# Patient Record
Sex: Female | Born: 1960 | Hispanic: No | Marital: Married | Smoking: Never smoker
Health system: Southern US, Community
[De-identification: ages and names within clinical notes are randomized; demographics above are authoritative.]

## PROBLEM LIST (undated history)

## (undated) DIAGNOSIS — O00109 Unspecified tubal pregnancy without intrauterine pregnancy: Secondary | ICD-10-CM

## (undated) DIAGNOSIS — N393 Stress incontinence (female) (male): Secondary | ICD-10-CM

## (undated) DIAGNOSIS — Z9852 Vasectomy status: Secondary | ICD-10-CM

## (undated) HISTORY — DX: Vasectomy status: Z98.52

## (undated) HISTORY — DX: Stress incontinence (female) (male): N39.3

---

## 1999-07-18 ENCOUNTER — Other Ambulatory Visit: Admission: RE | Admit: 1999-07-18 | Discharge: 1999-07-18 | Payer: Self-pay | Admitting: Obstetrics and Gynecology

## 2000-01-23 ENCOUNTER — Other Ambulatory Visit: Admission: RE | Admit: 2000-01-23 | Discharge: 2000-01-23 | Payer: Self-pay | Admitting: Obstetrics and Gynecology

## 2000-09-09 ENCOUNTER — Other Ambulatory Visit: Admission: RE | Admit: 2000-09-09 | Discharge: 2000-09-09 | Payer: Self-pay | Admitting: Obstetrics and Gynecology

## 2001-09-23 ENCOUNTER — Other Ambulatory Visit: Admission: RE | Admit: 2001-09-23 | Discharge: 2001-09-23 | Payer: Self-pay | Admitting: Obstetrics and Gynecology

## 2002-09-29 ENCOUNTER — Other Ambulatory Visit: Admission: RE | Admit: 2002-09-29 | Discharge: 2002-09-29 | Payer: Self-pay | Admitting: Obstetrics and Gynecology

## 2003-11-15 ENCOUNTER — Other Ambulatory Visit: Admission: RE | Admit: 2003-11-15 | Discharge: 2003-11-15 | Payer: Self-pay | Admitting: Obstetrics and Gynecology

## 2004-12-12 ENCOUNTER — Other Ambulatory Visit: Admission: RE | Admit: 2004-12-12 | Discharge: 2004-12-12 | Payer: Self-pay | Admitting: Obstetrics and Gynecology

## 2005-12-17 ENCOUNTER — Other Ambulatory Visit: Admission: RE | Admit: 2005-12-17 | Discharge: 2005-12-17 | Payer: Self-pay | Admitting: Obstetrics and Gynecology

## 2006-01-22 ENCOUNTER — Inpatient Hospital Stay (HOSPITAL_COMMUNITY): Admission: AD | Admit: 2006-01-22 | Discharge: 2006-01-22 | Payer: Self-pay | Admitting: Obstetrics and Gynecology

## 2006-01-25 ENCOUNTER — Inpatient Hospital Stay (HOSPITAL_COMMUNITY): Admission: AD | Admit: 2006-01-25 | Discharge: 2006-01-25 | Payer: Self-pay | Admitting: Obstetrics and Gynecology

## 2006-01-28 ENCOUNTER — Inpatient Hospital Stay (HOSPITAL_COMMUNITY): Admission: AD | Admit: 2006-01-28 | Discharge: 2006-01-28 | Payer: Self-pay | Admitting: Obstetrics and Gynecology

## 2006-02-04 ENCOUNTER — Inpatient Hospital Stay (HOSPITAL_COMMUNITY): Admission: AD | Admit: 2006-02-04 | Discharge: 2006-02-04 | Payer: Self-pay | Admitting: Obstetrics and Gynecology

## 2006-02-11 ENCOUNTER — Inpatient Hospital Stay (HOSPITAL_COMMUNITY): Admission: AD | Admit: 2006-02-11 | Discharge: 2006-02-11 | Payer: Self-pay | Admitting: Obstetrics and Gynecology

## 2008-12-20 ENCOUNTER — Ambulatory Visit (HOSPITAL_COMMUNITY): Admission: RE | Admit: 2008-12-20 | Discharge: 2008-12-20 | Payer: Self-pay | Admitting: Family Medicine

## 2009-08-23 ENCOUNTER — Ambulatory Visit: Payer: Self-pay | Admitting: Women's Health

## 2009-08-23 ENCOUNTER — Other Ambulatory Visit: Admission: RE | Admit: 2009-08-23 | Discharge: 2009-08-23 | Payer: Self-pay | Admitting: Gynecology

## 2010-02-14 ENCOUNTER — Ambulatory Visit: Payer: Self-pay | Admitting: Women's Health

## 2010-04-02 ENCOUNTER — Encounter: Payer: Self-pay | Admitting: Family Medicine

## 2010-05-01 ENCOUNTER — Ambulatory Visit (INDEPENDENT_AMBULATORY_CARE_PROVIDER_SITE_OTHER): Payer: PRIVATE HEALTH INSURANCE | Admitting: Emergency Medicine

## 2010-05-01 ENCOUNTER — Encounter (INDEPENDENT_AMBULATORY_CARE_PROVIDER_SITE_OTHER): Payer: Self-pay

## 2010-05-01 ENCOUNTER — Encounter: Payer: Self-pay | Admitting: Emergency Medicine

## 2010-05-01 DIAGNOSIS — E785 Hyperlipidemia, unspecified: Secondary | ICD-10-CM | POA: Insufficient documentation

## 2010-05-01 DIAGNOSIS — K5289 Other specified noninfective gastroenteritis and colitis: Secondary | ICD-10-CM

## 2010-05-04 ENCOUNTER — Telehealth (INDEPENDENT_AMBULATORY_CARE_PROVIDER_SITE_OTHER): Payer: Self-pay | Admitting: *Deleted

## 2010-05-08 NOTE — Letter (Signed)
Summary: Out of Work  [facility]  [address]   [place], [place] [address]   Phone: [phone]  Fax: [phone]    May 01, 2010   Employee:  Latasha Bass    To Whom It May Concern:   For Medical reasons, please excuse the above named employee from work for the following dates:  Start:   05/01/2010; May return on 05/02/2010    If you need additional information, please feel free to contact our office.         Sincerely,    [id number] Yetta Barre RN

## 2010-05-08 NOTE — Progress Notes (Signed)
  Phone Note Outgoing Call Call back at [facility] Phone [phone] P [facility]     Call placed by: Lajean Saver RN,  May 04, 2010 2:57 PM Call placed to: Patient Action Taken: Phone Call Completed Summary of Call: Callback: Patient reports her symptoms are gone.

## 2010-05-08 NOTE — Assessment & Plan Note (Signed)
Summary: STOMACH BUG(rm5)   Vital Signs:  Patient Profile:   50 Years Old Female CC:      Abdominal Pain Height:     62 inches Weight:      163 pounds O2 Sat:      100 % O2 treatment:    Room Air Pulse rate:   75 / minute Resp:     18 per minute BP sitting:   121 / 82  (left arm) Cuff size:   regular  Vitals Entered By: Burnard Hawthorne RN (May 01, 2010 9:24 AM)                  Updated Prior Medication List: No Medications Current Allergies: No known allergies History of Present Illness History from: patient Chief Complaint: Abdominal Pain History of Present Illness: N/V x1, but D x10  yesterday.  Has been doing on for a few days.  She thinks it is getting a little better but is tired.  She hasn't eaten today but kept down coffee.  No known sick contacts or spoiled food.  No F/C.  No other URI symptoms.  Not taking any OTC meds.  No blood in BM or emesis.  REVIEW OF SYSTEMS Constitutional Symptoms      Denies fever, chills, night sweats, weight loss, weight gain, and fatigue.  Eyes       Denies change in vision, eye pain, eye discharge, glasses, contact lenses, and eye surgery. Ear/Nose/Throat/Mouth       Denies hearing loss/aids, change in hearing, ear pain, ear discharge, dizziness, frequent runny nose, frequent nose bleeds, sinus problems, sore throat, hoarseness, and tooth pain or bleeding.  Respiratory       Denies dry cough, productive cough, wheezing, shortness of breath, asthma, bronchitis, and emphysema/COPD.  Cardiovascular       Denies murmurs, chest pain, and tires easily with exhertion.    Gastrointestinal       Complains of stomach pain, nausea/vomiting, and diarrhea.      Denies constipation, blood in bowel movements, and indigestion. Genitourniary       Denies painful urination, kidney stones, and loss of urinary control. Neurological       Denies paralysis, seizures, and fainting/blackouts. Musculoskeletal       Denies muscle pain, joint pain,  joint stiffness, decreased range of motion, redness, swelling, muscle weakness, and gout.  Skin       Denies bruising, unusual mles/lumps or sores, and hair/skin or nail changes.  Psych       Denies mood changes, temper/anger issues, anxiety/stress, speech problems, depression, and sleep problems. Other Comments: vomiting started Sunday night, w/out vomiting since one episode. Now after eating she has 5-6 loose stools.  states she has loss 5 lbs since Sunday   Past History:  Past Medical History: Hyperlipidemia  Past Surgical History: Denies surgical history  Family History: dad had cancer(unknown type)  Social History: non smoker alcohol- yes recreational drugs- no Physical Exam General appearance: well developed, well nourished, no acute distress Chest/Lungs: no rales, wheezes, or rhonchi bilateral, breath sounds equal without effort Heart: regular rate and  rhythm, no murmur Abdomen: mild epigastric and LLQ tenderness, no rebound, no distension, soft, +BS4Q, no RLQ or RUQ pain, no CVAT Skin: no obvious rashes or lesions MSE: oriented to time, place, and person Assessment New Problems: GASTROENTERITIS (ICD-558.9) HYPERLIPIDEMIA (ICD-272.4)   Patient Education: Patient and/or caregiver instructed in the following: rest, fluids.  Plan New Medications/Changes: PROMETHAZINE HCL 12.5 MG TABS (PROMETHAZINE HCL)  1 by mouth q6 hrs as needed  #15 x 0, 05/01/2010, Hoyt Koch MD  New Orders: New Patient Level III [phone] Planning Comments:   Patient with viral gastroenteritis.  Will treat with phenergan and OTC immodium.  I educated the patient on a bland diet for the next 6 days, hydration, eating smaller portions but getting some calories into her system which will give her energy and make her feel better.  Gave work note today to keep her from infecting others.  [place] virus has been going around but this seems to be milder.  Follow-up with your primary care physician  if not improving or if getting worse.   The patient and/or caregiver has been counseled thoroughly with regard to medications prescribed including dosage, schedule, interactions, rationale for use, and possible side effects and they verbalize understanding.  Diagnoses and expected course of recovery discussed and will return if not improved as expected or if the condition worsens. Patient and/or caregiver verbalized understanding.  Prescriptions: PROMETHAZINE HCL 12.5 MG TABS (PROMETHAZINE HCL) 1 by mouth q6 hrs as needed  #15 x 0   Entered and Authorized by:   Hoyt Koch MD   Signed by:   Hoyt Koch MD on 05/01/2010   Method used:   Print then Give to Patient   RxID:   [id number]   Orders Added: 1)  New Patient Level III [55732]

## 2010-08-23 ENCOUNTER — Ambulatory Visit: Payer: PRIVATE HEALTH INSURANCE

## 2011-05-06 DIAGNOSIS — N393 Stress incontinence (female) (male): Secondary | ICD-10-CM | POA: Insufficient documentation

## 2011-05-10 ENCOUNTER — Other Ambulatory Visit (HOSPITAL_COMMUNITY)
Admission: RE | Admit: 2011-05-10 | Discharge: 2011-05-10 | Disposition: A | Discharge status: Home / Self Care | Payer: PRIVATE HEALTH INSURANCE | Source: Ambulatory Visit | Attending: Obstetrics and Gynecology | Admitting: Obstetrics and Gynecology

## 2011-05-10 ENCOUNTER — Encounter: Payer: Self-pay | Admitting: Women's Health

## 2011-05-10 ENCOUNTER — Ambulatory Visit (INDEPENDENT_AMBULATORY_CARE_PROVIDER_SITE_OTHER): Payer: PRIVATE HEALTH INSURANCE | Admitting: Women's Health

## 2011-05-10 VITALS — BP 112/70 | Ht 62.5 in | Wt 159.0 lb

## 2011-05-10 DIAGNOSIS — Z833 Family history of diabetes mellitus: Secondary | ICD-10-CM

## 2011-05-10 DIAGNOSIS — G4700 Insomnia, unspecified: Secondary | ICD-10-CM

## 2011-05-10 DIAGNOSIS — R350 Frequency of micturition: Secondary | ICD-10-CM

## 2011-05-10 DIAGNOSIS — Z01419 Encounter for gynecological examination (general) (routine) without abnormal findings: Secondary | ICD-10-CM | POA: Insufficient documentation

## 2011-05-10 DIAGNOSIS — Z113 Encounter for screening for infections with a predominantly sexual mode of transmission: Secondary | ICD-10-CM

## 2011-05-10 DIAGNOSIS — Z13220 Encounter for screening for lipoid disorders: Secondary | ICD-10-CM

## 2011-05-10 LAB — URINALYSIS W MICROSCOPIC + REFLEX CULTURE
Bilirubin Urine: NEGATIVE
Glucose, UA: NEGATIVE mg/dL
Leukocytes, UA: NEGATIVE
Protein, ur: NEGATIVE mg/dL
Specific Gravity, Urine: 1.015 (ref 1.005–1.030)
Urobilinogen, UA: 0.2 mg/dL (ref 0.0–1.0)

## 2011-05-10 MED ORDER — ZOLPIDEM TARTRATE 10 MG PO TABS: 10.0000 mg | ORAL_TABLET | Freq: Every evening | ORAL | Status: DC | PRN

## 2011-05-10 NOTE — Progress Notes (Signed)
Latasha Bass [birth year] [id number]    History:    The patient presents for annual exam.  Cycles every 2-4 months for 3 days for the past year,LMP 12/12. New partner/vasectomy. Continues with problems with urinary frequency, with no pain or burning. Denies menopausal symptoms. Has had problems with insomnia for years has gotten increasingly problematic. History of normal Paps, only Pap in our office was 6/11. Has not had a mammogram for several years, patient reports normal.    Past medical history, past surgical history, family history and social history were all reviewed and documented in the EPIC chart. Currently on [employer]. Son Molli Hazard expected to graduate from [employer] in May.  ROS:  A  ROS was performed and pertinent positives and negatives are included in the history.  Exam:  Filed Vitals:   05/10/11 1551  BP: 112/70    General appearance:  Normal Head/Neck:  Normal, without cervical or supraclavicular adenopathy. Thyroid:  Symmetrical, normal in size, without palpable masses or nodularity. Respiratory  Effort:  Normal  Auscultation:  Clear without wheezing or rhonchi Cardiovascular  Auscultation:  Regular rate, without rubs, murmurs or gallops  Edema/varicosities:  Not grossly evident Abdominal  Soft,nontender, without masses, guarding or rebound.  Liver/spleen:  No organomegaly noted  Hernia:  None appreciated  Skin  Inspection:  Grossly normal  Palpation:  Grossly normal Neurologic/psychiatric  Orientation:  Normal with appropriate conversation.  Mood/affect:  Normal  Genitourinary    Breasts: Examined lying and sitting.     Right: Without masses, retractions, discharge or axillary adenopathy.     Left: Without masses, retractions, discharge or axillary adenopathy.   Inguinal/mons:  Normal without inguinal adenopathy  External genitalia:  Normal  BUS/Urethra/Skene's glands:  Normal  Bladder:  Normal  Vagina:  Normal  Cervix:  Normal/stenotic  Uterus:    normal in size, shape and contour.  Midline and mobile  Adnexa/parametria:     Rt: Without masses or tenderness.   Lt: Without masses or tenderness.  Anus and perineum: Normal  Digital rectal exam: Normal sphincter tone without palpated masses or tenderness  Assessment/Plan:  51 y.o. DHF G3 P1 for annual exam.    Perimenopausal irregular cycles Insomnia Urinary frequency/always without infection  Plan: Reviewed if cycle goes greater than 3 months to return to office for [facility]. Condoms encouraged until permanent partner. SBE's, annual mammogram, [facility] given, instructed to schedule. UA completely negative, instructed to schedule screening colonoscopy. Encouraged to continue increased exercise, decreasing calories for weight loss. History of increased triglycerides, discussed fish oil supplement, low saturated fat diet. Sleep hygiene discussed, Ambien 10 mg by mouth at bedtime when necessary #30 with no refills. CBC, glucose, lipid profile, UA, Pap., GC/Chlamydia, declines HIV, hepatitis and RPR. Reviewed over active bladder, urologist consult declines.     Harrington Challenger [facility], 4:56 PM 05/10/2011 `

## 2011-05-10 NOTE — Patient Instructions (Signed)
Insomnia Insomnia is frequent trouble falling and/or staying asleep. Insomnia can be a long term problem or a short term problem. Both are common. Insomnia can be a short term problem when the wakefulness is related to a certain stress or worry. Long term insomnia is often related to ongoing stress during waking hours and/or poor sleeping habits. Overtime, sleep deprivation itself can make the problem worse. Every little thing feels more severe because you are overtired and your ability to cope is decreased. CAUSES   Stress, anxiety, and depression.   Poor sleeping habits.   Distractions such as TV in the bedroom.   Naps close to bedtime.   Engaging in emotionally charged conversations before bed.   Technical reading before sleep.   Alcohol and other sedatives. They may make the problem worse. They can hurt normal sleep patterns and normal dream activity.   Stimulants such as caffeine for several hours prior to bedtime.   Pain syndromes and shortness of breath can cause insomnia.   Exercise late at night.   Changing time zones may cause sleeping problems (jet lag).  It is sometimes helpful to have someone observe your sleeping patterns. They should look for periods of not breathing during the night (sleep apnea). They should also look to see how long those periods last. If you live alone or observers are uncertain, you can also be observed at a sleep clinic where your sleep patterns will be professionally monitored. Sleep apnea requires a checkup and treatment. Give your caregivers your medical history. Give your caregivers observations your family has made about your sleep.  SYMPTOMS   Not feeling rested in the morning.   Anxiety and restlessness at bedtime.   Difficulty falling and staying asleep.  TREATMENT   Your caregiver may prescribe treatment for an underlying medical disorders. Your caregiver can give advice or help if you are using alcohol or other drugs for  self-medication. Treatment of underlying problems will usually eliminate insomnia problems.   Medications can be prescribed for short time use. They are generally not recommended for lengthy use.   Over-the-counter sleep medicines are not recommended for lengthy use. They can be habit forming.   You can promote easier sleeping by making lifestyle changes such as:   Using relaxation techniques that help with breathing and reduce muscle tension.   Exercising earlier in the day.   Changing your diet and the time of your last meal. No night time snacks.   Establish a regular time to go to bed.   Counseling can help with stressful problems and worry.   Soothing music and white noise may be helpful if there are background noises you cannot remove.   Stop tedious detailed work at least one hour before bedtime.  HOME CARE INSTRUCTIONS   Keep a diary. Inform your caregiver about your progress. This includes any medication side effects. See your caregiver regularly. Take note of:   Times when you are asleep.   Times when you are awake during the night.   The quality of your sleep.   How you feel the next day.  This information will help your caregiver care for you.  Get out of bed if you are still awake after 15 minutes. Read or do some quiet activity. Keep the lights down. Wait until you feel sleepy and go back to bed.   Keep regular sleeping and waking hours. Avoid naps.   Exercise regularly.   Avoid distractions at bedtime. Distractions include watching television or engaging   in any intense or detailed activity like attempting to balance the household checkbook.   Develop a bedtime ritual. Keep a familiar routine of bathing, brushing your teeth, climbing into bed at the same time each night, listening to soothing music. Routines increase the success of falling to sleep faster.   Use relaxation techniques. This can be using breathing and muscle tension release routines. It can  also include visualizing peaceful scenes. You can also help control troubling or intruding thoughts by keeping your mind occupied with boring or repetitive thoughts like the old concept of counting sheep. You can make it more creative like imagining planting one beautiful flower after another in your backyard garden.   During your day, work to eliminate stress. When this is not possible use some of the previous suggestions to help reduce the anxiety that accompanies stressful situations.  MAKE SURE YOU:   Understand these instructions.   Will watch your condition.   Will get help right away if you are not doing well or get worse.  Document Released: 02/23/2000 Document Revised: 11/07/2010 Document Reviewed: 03/25/2007 [facility] Patient Information 2012 [place], [place].Health Maintenance, Females A healthy lifestyle and preventative care can promote health and wellness.  Maintain regular health, dental, and eye exams.   Eat a healthy diet. Foods like vegetables, fruits, whole grains, low-fat dairy products, and lean protein foods contain the nutrients you need without too many calories. Decrease your intake of foods high in solid fats, added sugars, and salt. Get information about a proper diet from your caregiver, if necessary.   Regular physical exercise is one of the most important things you can do for your health. Most adults should get at least 150 minutes of moderate-intensity exercise (any activity that increases your heart rate and causes you to sweat) each week. In addition, most adults need muscle-strengthening exercises on 2 or more days a week.    Maintain a healthy weight. The body mass index (BMI) is a screening tool to identify possible weight problems. It provides an estimate of body fat based on height and weight. Your caregiver can help determine your BMI, and can help you achieve or maintain a healthy weight. For adults 20 years and older:   A BMI below 18.5 is considered  underweight.   A BMI of 18.5 to 24.9 is normal.   A BMI of 25 to 29.9 is considered overweight.   A BMI of 30 and above is considered obese.   Maintain normal blood lipids and cholesterol by exercising and minimizing your intake of saturated fat. Eat a balanced diet with plenty of fruits and vegetables. Blood tests for lipids and cholesterol should begin at age 12 and be repeated every 5 years. If your lipid or cholesterol levels are high, you are over 50, or you are a high risk for heart disease, you may need your cholesterol levels checked more frequently.Ongoing high lipid and cholesterol levels should be treated with medicines if diet and exercise are not effective.   If you smoke, find out from your caregiver how to quit. If you do not use tobacco, do not start.   If you are pregnant, do not drink alcohol. If you are breastfeeding, be very cautious about drinking alcohol. If you are not pregnant and choose to drink alcohol, do not exceed 1 drink per day. One drink is considered to be 12 ounces (355 mL) of beer, 5 ounces (148 mL) of wine, or 1.5 ounces (44 mL) of liquor.   Avoid use  of street drugs. Do not share needles with anyone. Ask for help if you need support or instructions about stopping the use of drugs.   High blood pressure causes heart disease and increases the risk of stroke. Blood pressure should be checked at least every 1 to 2 years. Ongoing high blood pressure should be treated with medicines, if weight loss and exercise are not effective.   If you are 1 to 51 years old, ask your caregiver if you should take aspirin to prevent strokes.   Diabetes screening involves taking a blood sample to check your fasting blood sugar level. This should be done once every 3 years, after age 95, if you are within normal weight and without risk factors for diabetes. Testing should be considered at a younger age or be carried out more frequently if you are overweight and have at least 1 risk  factor for diabetes.   Breast cancer screening is essential preventative care for women. You should practice "breast self-awareness." This means understanding the normal appearance and feel of your breasts and may include breast self-examination. Any changes detected, no matter how small, should be reported to a caregiver. Women in their 51s and 30s should have a clinical breast exam (CBE) by a caregiver as part of a regular health exam every 1 to 3 years. After age 52, women should have a CBE every year. Starting at age 21, women should consider having a mammogram (breast X-ray) every year. Women who have a family history of breast cancer should talk to their caregiver about genetic screening. Women at a high risk of breast cancer should talk to their caregiver about having an MRI and a mammogram every year.   The Pap test is a screening test for cervical cancer. Women should have a Pap test starting at age 9. Between ages 67 and 29, Pap tests should be repeated every 2 years. Beginning at age 1, you should have a Pap test every 3 years as long as the past 3 Pap tests have been normal. If you had a hysterectomy for a problem that was not cancer or a condition that could lead to cancer, then you no longer need Pap tests. If you are between ages 17 and 55, and you have had normal Pap tests going back 10 years, you no longer need Pap tests. If you have had past treatment for cervical cancer or a condition that could lead to cancer, you need Pap tests and screening for cancer for at least 20 years after your treatment. If Pap tests have been discontinued, risk factors (such as a new sexual partner) need to be reassessed to determine if screening should be resumed. Some women have medical problems that increase the chance of getting cervical cancer. In these cases, your caregiver may recommend more frequent screening and Pap tests.   The human papillomavirus (HPV) test is an additional test that may be used for  cervical cancer screening. The HPV test looks for the virus that can cause the cell changes on the cervix. The cells collected during the Pap test can be tested for HPV. The HPV test could be used to screen women aged 59 years and older, and should be used in women of any age who have unclear Pap test results. After the age of 54, women should have HPV testing at the same frequency as a Pap test.   Colorectal cancer can be detected and often prevented. Most routine colorectal cancer screening begins at the age  of 82 and continues through age 19. However, your caregiver may recommend screening at an earlier age if you have risk factors for colon cancer. On a yearly basis, your caregiver may provide home test kits to check for hidden blood in the stool. Use of a small camera at the end of a tube, to directly examine the colon (sigmoidoscopy or colonoscopy), can detect the earliest forms of colorectal cancer. Talk to your caregiver about this at age 63, when routine screening begins. Direct examination of the colon should be repeated every 5 to 10 years through age 11, unless early forms of pre-cancerous polyps or small growths are found.   Practice safe sex. Use condoms and avoid high-risk sexual practices to reduce the spread of sexually transmitted infections (STIs). Sexually active women aged 4 and younger should be checked for Chlamydia, which is a common sexually transmitted infection. Older women with new or multiple partners should also be tested for Chlamydia. Testing for other STIs is recommended if you are sexually active and at increased risk.   Osteoporosis is a disease in which the bones lose minerals and strength with aging. This can result in serious bone fractures. The risk of osteoporosis can be identified using a bone density scan. Women ages 56 and over and women at risk for fractures or osteoporosis should discuss screening with their caregivers. Ask your caregiver whether you should be  taking a calcium supplement or vitamin D to reduce the rate of osteoporosis.   Menopause can be associated with physical symptoms and risks. Hormone replacement therapy is available to decrease symptoms and risks. You should talk to your caregiver about whether hormone replacement therapy is right for you.   Use sunscreen with a sun protection factor (SPF) of 30 or greater. Apply sunscreen liberally and repeatedly throughout the day. You should seek shade when your shadow is shorter than you. Protect yourself by wearing long sleeves, pants, a wide-brimmed hat, and sunglasses year round, whenever you are outdoors.   Notify your caregiver of new moles or changes in moles, especially if there is a change in shape or color. Also notify your caregiver if a mole is larger than the size of a pencil eraser.   Stay current with your immunizations.  Document Released: 09/10/2010 Document Revised: 11/07/2010 Document Reviewed: 09/10/2010 [facility] Patient Information 2012 [place], [place].

## 2011-05-11 LAB — CBC WITH DIFFERENTIAL/PLATELET
Basophils Relative: 0 % (ref 0–1)
Eosinophils Absolute: 0.1 10*3/uL (ref 0.0–0.7)
Hemoglobin: 12.8 g/dL (ref 12.0–15.0)
Lymphs Abs: 2 10*3/uL (ref 0.7–4.0)
MCH: 28.4 pg (ref 26.0–34.0)
MCV: 88.2 fL (ref 78.0–100.0)
Monocytes Relative: 6 % (ref 3–12)
Platelets: 302 10*3/uL (ref 150–400)
WBC: 7.1 10*3/uL (ref 4.0–10.5)

## 2011-05-11 LAB — LIPID PANEL
HDL: 66 mg/dL (ref >39)
LDL Cholesterol: 131 mg/dL — ABNORMAL HIGH (ref 0–99)
Total CHOL/HDL Ratio: 3.4 Ratio
VLDL: 26 mg/dL (ref 0–40)

## 2011-05-11 LAB — GLUCOSE, RANDOM: Glucose, Bld: 94 mg/dL (ref 70–99)

## 2011-05-11 LAB — GC/CHLAMYDIA PROBE AMP, GENITAL: GC Probe Amp, Genital: NEGATIVE

## 2011-08-30 ENCOUNTER — Other Ambulatory Visit: Payer: Self-pay | Admitting: Women's Health

## 2011-08-30 DIAGNOSIS — Z1231 Encounter for screening mammogram for malignant neoplasm of breast: Secondary | ICD-10-CM

## 2011-09-24 ENCOUNTER — Ambulatory Visit (HOSPITAL_COMMUNITY)
Admission: RE | Admit: 2011-09-24 | Discharge: 2011-09-24 | Disposition: A | Discharge status: Home / Self Care | Payer: PRIVATE HEALTH INSURANCE | Source: Ambulatory Visit | Attending: Women's Health | Admitting: Women's Health

## 2011-09-24 DIAGNOSIS — Z1231 Encounter for screening mammogram for malignant neoplasm of breast: Secondary | ICD-10-CM

## 2012-05-18 ENCOUNTER — Encounter: Payer: PRIVATE HEALTH INSURANCE | Admitting: Women's Health

## 2012-05-25 ENCOUNTER — Encounter: Payer: PRIVATE HEALTH INSURANCE | Admitting: Women's Health

## 2012-06-08 ENCOUNTER — Encounter: Payer: Self-pay | Admitting: Women's Health

## 2012-06-08 ENCOUNTER — Ambulatory Visit (INDEPENDENT_AMBULATORY_CARE_PROVIDER_SITE_OTHER): Payer: PRIVATE HEALTH INSURANCE | Admitting: Women's Health

## 2012-06-08 VITALS — BP 112/72 | Ht 62.5 in | Wt 148.5 lb

## 2012-06-08 DIAGNOSIS — Z780 Asymptomatic menopausal state: Secondary | ICD-10-CM

## 2012-06-08 DIAGNOSIS — Z13220 Encounter for screening for lipoid disorders: Secondary | ICD-10-CM

## 2012-06-08 DIAGNOSIS — Z01419 Encounter for gynecological examination (general) (routine) without abnormal findings: Secondary | ICD-10-CM

## 2012-06-08 DIAGNOSIS — Z833 Family history of diabetes mellitus: Secondary | ICD-10-CM

## 2012-06-08 LAB — CBC WITH DIFFERENTIAL/PLATELET
Eosinophils Absolute: 0.1 10*3/uL (ref 0.0–0.7)
Eosinophils Relative: 1 % (ref 0–5)
HCT: 39.7 % (ref 36.0–46.0)
Lymphocytes Relative: 39 % (ref 12–46)
Lymphs Abs: 2.7 10*3/uL (ref 0.7–4.0)
MCH: 29.2 pg (ref 26.0–34.0)
MCV: 87.3 fL (ref 78.0–100.0)
Neutro Abs: 3.7 10*3/uL (ref 1.7–7.7)
Platelets: 294 10*3/uL (ref 150–400)
RBC: 4.55 MIL/uL (ref 3.87–5.11)
RDW: 13.1 % (ref 11.5–15.5)
WBC: 6.9 10*3/uL (ref 4.0–10.5)

## 2012-06-08 LAB — LIPID PANEL
Cholesterol: 269 mg/dL — ABNORMAL HIGH (ref 0–200)
Triglycerides: 94 mg/dL (ref <150)
VLDL: 19 mg/dL (ref 0–40)

## 2012-06-08 MED ORDER — ESTRADIOL 10 MCG VA TABS: 1.0000 | ORAL_TABLET | VAGINAL | Status: DC

## 2012-06-08 NOTE — Progress Notes (Signed)
Latasha Bass 1961/03/11 [id number]    History:    The patient presents for annual exam.  Amenorrheic greater than one year, no longer having menopausal symptoms, occasional vaginal dryness. Continues to have stress incontinence with exercise and urinary frequency. Husband vasectomy. Continues to have problems with insomnia using natural products. History of normal Paps and mammograms. Has had some elevated cholesterol in the past but has lost 10 pounds in past year with exercise and Weight Watchers. Has not had a colonoscopy. Normal DEXA in primary care 2 years ago.   Past medical history, past surgical history, family history and social history were all reviewed and documented in the EPIC chart. Works at a call center/distribution area. Molli Hazard graduated [employer] and is working. Has 2 stepsons. Parents hypertensive, father bladder cancer.   ROS:  A  ROS was performed and pertinent positives and negatives are included in the history.  Exam:  Filed Vitals:   06/08/12 1611  BP: 112/72    General appearance:  Normal Head/Neck:  Normal, without cervical or supraclavicular adenopathy. Thyroid:  Symmetrical, normal in size, without palpable masses or nodularity. Respiratory  Effort:  Normal  Auscultation:  Clear without wheezing or rhonchi Cardiovascular  Auscultation:  Regular rate, without rubs, murmurs or gallops  Edema/varicosities:  Not grossly evident Abdominal  Soft,nontender, without masses, guarding or rebound.  Liver/spleen:  No organomegaly noted  Hernia:  None appreciated  Skin  Inspection:  Grossly normal  Palpation:  Grossly normal Neurologic/psychiatric  Orientation:  Normal with appropriate conversation.  Mood/affect:  Normal  Genitourinary    Breasts: Examined lying and sitting.     Right: Without masses, retractions, discharge or axillary adenopathy.     Left: Without masses, retractions, discharge or axillary adenopathy.   Inguinal/mons:  Normal without  inguinal adenopathy  External genitalia:  Normal  BUS/Urethra/Skene's glands:  Normal  Bladder:  Normal  Vagina:  Normal/mild atrophic changes  Cervix:  Normal  Uterus:  normal in size, shape and contour.  Midline and mobile  Adnexa/parametria:     Rt: Without masses or tenderness.   Lt: Without masses or tenderness.  Anus and perineum: Normal  Digital rectal exam: Normal sphincter tone without palpated masses or tenderness  Assessment/Plan:  52 y.o. MWF G3P1 for annual exam with complaint of increasing stress incontinence with exercise.  Postmenopausal on no HRT/no bleeding Insomnia   Plan: SBE's, continue annual mammogram, calcium rich diet, vitamin D 2000 daily encouraged. Instructed to schedule DEXA., Will schedule here. CBC, glucose, lipid panel, UA, Pap normal 2013, new screening guidelines reviewed. Instructed to schedule colonoscopy. Congratulated on weight loss with diet and exercise. Insomnia reviewed declines other options, sleep hygiene reviewed. Reviewed Vagifem for stress incontinence may help, samples given slight systemic absorption with risks of blood clots and strokes reviewed. Instructed to call if no relief.     Harrington Challenger WHNP, 5:22 PM 06/08/2012

## 2012-06-08 NOTE — Patient Instructions (Signed)
Colonoscopy  Dr Loreta Ave or Baird Lyons Vit D 2000 daily Dexa/ Bone density Health Recommendations for Postmenopausal Women Based on the Results of the Women's Health Initiative [facility]) and Other Studies The WHI is a major 15-year research program to address the most common causes of death, disability and poor quality of life in postmenopausal women. Some of these causes are heart disease, cancer, bone loss (osteoporosis) and others. Taking into account all of the findings from [facility] and other studies, here are bottom-line health recommendations for women: CARDIOVASCULAR DISEASE Heart Disease: A heart attack is a medical emergency. Know the signs and symptoms of a heart attack. Hormone therapy should not be used to prevent heart disease. In women with heart disease, hormone therapy should not be used to prevent further disease. Hormone therapy increases the risk of blood clots. Below are things women can do to reduce their risk for heart disease.   Do not smoke. If you smoke, quit. Women who smoke are 2 to 6 times more likely to suffer a heart attack than non-smoking women.  Aim for a healthy weight. Being overweight causes many preventable deaths. Eat a healthy and balanced diet and drink an adequate amount of liquids.  Get moving. Make a commitment to be more physically active. Aim for 30 minutes of activity on most, if not all days of the week.  Eat for heart health. Choose a diet that is low in saturated fat, trans fat, and cholesterol. Include whole grains, vegetables, and fruits. Read the labels on the food container before buying it.  Know your numbers. Ask your caregiver to check your blood pressure, cholesterol (total, HDL, LDL, triglycerides) and blood glucose. Work with your caregiver to improve any numbers that are not normal.  High blood pressure. Limit or stop your table salt intake (try salt substitute and food seasonings), avoid salty foods and drinks. Read the labels on the food container  before buying it. Avoid becoming overweight by eating well and exercising. STROKE  Stroke is a medical emergency. Stroke can be the result of a blood clot in the blood vessel in the brain or by a brain hemorrhage (bleeding). Know the signs and symptoms of a stroke. To lower the risk of developing a stroke:  Avoid fatty foods.  Quit smoking.  Control your diabetes, blood pressure, and irregular heart rate. THROMBOPHLIBITIS (BLOOD CLOT) OF THE LEG  Hormone treatment is a big cause of developing blood clots in the leg. Becoming overweight and leading a stationary lifestyle also may contribute to developing blood clots. Controlling your diet and exercising will help lower the risk of developing blood clots. CANCER SCREENING  Breast Cancer: Women should take steps to reduce their risk of breast cancer. This includes having regular mammograms, monthly self breast exams and regular breast exams by your caregiver. Have a mammogram every one to two years if you are 77 to 52 years old. Have a mammogram annually if you are 32 years old or older depending on your risk factors. Women who are high risk for breast cancer may need more frequent mammograms. There are tests available (testing the genes in your body) if you have family history of breast cancer called BRCA 1 and 2. These tests can help determine the risks of developing breast cancer.  Intestinal or Stomach Cancer: Women should talk to their caregiver about when to start screening, what tests and how often they should be done, and the benefits and risks of doing these tests. Tests to consider are a  rectal exam, fecal occult blood, sigmoidoscopy, colononoscoby, barium enema and upper GI series of the stomach. Depending on the age, you may want to get a medical and family history of colon cancer. Women who are high risk may need to be screened at an earlier age and more often.  Cervical Cancer: A Pap test of the cervix should be done every year and every  3 years when there has been three straight years of a normal Pap test. Women with an abnormal Pap test should be screened more often or have a cervical biopsy depending on your caregiver's recommendation.  Uterine Cancer: If you have vaginal bleeding after you are in the menopause, it should be evaluated by your caregiver.  Ovarian cancer: There are no reliable tests available to screen for ovarian cancer at this time except for yearly pelvic exams.  Lung Cancer: Yearly chest X-rays can detect lung cancer and should be done on high risk women, such as cigarette smokers and women with chronic lung disease (emphysemia).  Skin Cancer: A complete body skin exam should be done at your yearly examination. Avoid overexposure to the sun and ultraviolet light lamps. Use a strong sun block cream when in the sun. All of these things are important in lowering the risk of skin cancer. MENOPAUSE Menopause Symptoms: Hormone therapy products are effective for treating symptoms associated with menopause:  Moderate to severe hot flashes.  Night sweats.  Mood swings.  Headaches.  Tiredness.  Loss of sex drive.  Insomnia.  Other symptoms. However, hormone therapy products carry serious risks, especially in older women. Women who use or are thinking about using estrogen or estrogen with progestin treatments should discuss that with their caregiver. Your caregiver will know if the benefits outweigh the risks. The Food and Drug Administration (FDA) has concluded that hormone therapy should be used only at the lowest doses and for the shortest amount of time to reach treatment goals. It is not known at what doses there may be less risk of serious side effects. There are other treatments such as herbal medication (not controlled or regulated by the FDA), group therapy, counseling and acupuncture that may be helpful. OSTEOPOROSIS Protecting Against Bone Loss and Preventing Fracture: If hormone therapy is used for  prevention of bone loss (osteoporosis), the risks for bone loss must outweigh the risk of the therapy. Women considering taking hormone therapy for bone loss should ask their health care providers about other medications (fosamax and boniva) that are considered safe and effective for preventing bone loss and bone fractures. To guard against bone loss or fractures, it is recommended that women should take at least 1000-1500 mg of calcium and 400-800 IU of vitamin D daily in divided doses. Smoking and excessive alcohol intake increases the risk of osteoporosis. Eat foods rich in calcium and vitamin D and do weight bearing exercises several times a week as your caregiver suggests. DIABETES Diabetes Melitus: Women with Type I or Type 2 diabetes should keep their diabetes in control with diet, exercise and medication. Avoid too many sweets, starchy and fatty foods. Being overweight can affect your diabetes. COGNITION AND MEMORY Cognition and Memory: Menopausal hormone therapy is not recommended for the prevention of cognitive disorders such as Alzheimer's disease or memory loss. WHI found that women treated with hormone therapy have a greater risk of developing dementia.  DEPRESSION  Depression may occur at any age, but is common in elderly women. The reasons may be because of physical, medical, social (loneliness), financial  and/or economic problems and needs. Becoming involved with church, volunteer or social groups, seeking treatment for any physical or medical problems is recommended. Also, look into getting professional advice for any economic or financial problems. ACCIDENTS  Accidents are common and can be serious in the elderly woman. Prepare your house to prevent accidents. Eliminate throw rugs, use hip protectors, place hand bars in the bath, shower and toilet areas. Avoid wearing high heel shoes and walking on wet, snowy and icy areas. Stop driving if you have vision, hearing problems or are unsteady  with you movements and reflexes. RHEUMATOID ARTHRITIS Rheumatoid arthritis causes pain, swelling and stiffness of your bone joints. It can limit many of your activities. Over-the-counter medications may help, but prescription medications may be necessary. Talk with your caregiver about this. Exercise (walking, water aerobics), good posture, using splints on painful joints, warm baths or applying warm compresses to stiff joints and cold compresses to painful joints may be helpful. Smoking and excessive drinking may worsen the symptoms of arthritis. Seek help from a physical therapist if the arthritis is becoming a problem with your daily activities. IMMUNIZATIONS  Several immunizations are important to have during your senior years, including:   Tetanus and a diptheria shot booster every 10 years.  Influenza every year before the flu season begins.  Pneumonia vaccine.  Shingles vaccine.  Others as indicated (example: H1N1 vaccine). Document Released: 04/19/2005 Document Revised: 05/20/2011 Document Reviewed: 12/14/2007 [facility] Patient Information 2013 [place], [place].

## 2012-06-09 LAB — URINALYSIS W MICROSCOPIC + REFLEX CULTURE
Bacteria, UA: NONE SEEN
Casts: NONE SEEN
Crystals: NONE SEEN
Ketones, ur: NEGATIVE mg/dL
Nitrite: NEGATIVE
Protein, ur: NEGATIVE mg/dL
Squamous Epithelial / LPF: NONE SEEN

## 2013-01-12 ENCOUNTER — Other Ambulatory Visit (HOSPITAL_COMMUNITY): Payer: Self-pay | Admitting: Family Medicine

## 2013-01-12 DIAGNOSIS — Z1231 Encounter for screening mammogram for malignant neoplasm of breast: Secondary | ICD-10-CM

## 2013-02-09 ENCOUNTER — Ambulatory Visit (HOSPITAL_COMMUNITY)
Admission: RE | Admit: 2013-02-09 | Discharge: 2013-02-09 | Disposition: A | Discharge status: Home / Self Care | Payer: 59 | Source: Ambulatory Visit | Attending: Family Medicine | Admitting: Family Medicine

## 2013-02-09 DIAGNOSIS — Z1231 Encounter for screening mammogram for malignant neoplasm of breast: Secondary | ICD-10-CM | POA: Insufficient documentation

## 2014-01-10 ENCOUNTER — Encounter: Payer: Self-pay | Admitting: Women's Health

## 2014-05-09 ENCOUNTER — Other Ambulatory Visit (HOSPITAL_COMMUNITY): Payer: Self-pay | Admitting: Family Medicine

## 2014-05-09 DIAGNOSIS — Z1231 Encounter for screening mammogram for malignant neoplasm of breast: Secondary | ICD-10-CM

## 2014-05-16 ENCOUNTER — Ambulatory Visit (HOSPITAL_COMMUNITY): Payer: PRIVATE HEALTH INSURANCE

## 2014-05-17 ENCOUNTER — Ambulatory Visit (HOSPITAL_COMMUNITY): Payer: PRIVATE HEALTH INSURANCE

## 2014-05-19 ENCOUNTER — Ambulatory Visit (HOSPITAL_COMMUNITY)
Admission: RE | Admit: 2014-05-19 | Discharge: 2014-05-19 | Disposition: A | Discharge status: Home / Self Care | Payer: 59 | Source: Ambulatory Visit | Attending: Family Medicine | Admitting: Family Medicine

## 2014-05-19 DIAGNOSIS — Z1231 Encounter for screening mammogram for malignant neoplasm of breast: Secondary | ICD-10-CM | POA: Diagnosis present

## 2015-07-11 ENCOUNTER — Encounter: Payer: Self-pay | Admitting: Women's Health

## 2015-07-11 ENCOUNTER — Ambulatory Visit (INDEPENDENT_AMBULATORY_CARE_PROVIDER_SITE_OTHER): Payer: 59 | Admitting: Women's Health

## 2015-07-11 VITALS — BP 126/80 | Ht 62.0 in | Wt 142.0 lb

## 2015-07-11 DIAGNOSIS — Z01419 Encounter for gynecological examination (general) (routine) without abnormal findings: Secondary | ICD-10-CM | POA: Diagnosis not present

## 2015-07-11 NOTE — Patient Instructions (Signed)

## 2015-07-11 NOTE — Progress Notes (Signed)
Cranford MonMaricela P Moseley 05/03/1960 [id number]    History:    Presents for annual exam.  Postmenopausal/no HRT/no bleeding. Normal Pap and mammogram history. Stress incontinence with exercise. Reports normal DEXA at primary care. 2016 colonoscopy negative.  Past medical history, past surgical history, family history and social history were all reviewed and documented in the EPIC chart. 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  ROS:  A ROS was performed and pertinent positives and negatives are included.  Exam:  Filed Vitals:   07/11/15 1517  BP: 126/80    General appearance:  Normal Thyroid:  Symmetrical, normal in size, without palpable masses or nodularity. Respiratory  Auscultation:  Clear without wheezing or rhonchi Cardiovascular  Auscultation:  Regular rate, without rubs, murmurs or gallops  Edema/varicosities:  Not grossly evident Abdominal  Soft,nontender, without masses, guarding or rebound.  Liver/spleen:  No organomegaly noted  Hernia:  None appreciated  Skin  Inspection:  Grossly normal   Breasts: Examined lying and sitting.     Right: Without masses, retractions, discharge or axillary adenopathy.     Left: Without masses, retractions, discharge or axillary adenopathy. Gentitourinary   Inguinal/mons:  Normal without inguinal adenopathy  External genitalia:  Normal  BUS/Urethra/Skene's glands:  Normal  Vagina:  Normal  Cervix:  Normal  Uterus:  normal in size, shape and contour.  Midline and mobile  Adnexa/parametria:     Rt: Without masses or tenderness.   Lt: Without masses or tenderness.  Anus and perineum: Normal  Digital rectal exam: Normal sphincter tone without palpated masses or tenderness  Assessment/Plan:  55 y.o. MWF G1 P1 for annual exam no complaints.  Postmenopausal/no HRT/no bleeding Stress incontinence with exercise Primary care manages labs  Plan: SBE's, continue annual screening mammogram, do, instructed to schedule, exercise, calcium  rich diet, vitamin D 2000 daily encouraged. Instructed to have vitamin D level checked at primary care. Dexa instructed to schedule. Home safety, fall prevention and importance of continuing regular weightbearing exercise reviewed. Sleep hygiene reviewed. UA, Pap with HR HPV typing, new screening guidelines reviewed.    Harrington ChallengerYOUNG,Brittani Purdum J Goldstep Ambulatory Surgery Center LLCWHNP, 5:07 PM 07/11/2015

## 2015-07-12 LAB — URINALYSIS W MICROSCOPIC + REFLEX CULTURE
BILIRUBIN URINE: NEGATIVE
Bacteria, UA: NONE SEEN [HPF]
Casts: NONE SEEN [LPF]
Crystals: NONE SEEN [HPF]
GLUCOSE, UA: NEGATIVE
HGB URINE DIPSTICK: NEGATIVE
KETONES UR: NEGATIVE
Leukocytes, UA: NEGATIVE
NITRITE: NEGATIVE
PH: 7.5 (ref 5.0–8.0)
Protein, ur: NEGATIVE
RBC / HPF: NONE SEEN RBC/HPF (ref <=2)
SPECIFIC GRAVITY, URINE: 1.021 (ref 1.001–1.035)
SQUAMOUS EPITHELIAL / LPF: NONE SEEN [HPF] (ref <=5)
WBC UA: NONE SEEN WBC/HPF (ref <=5)
Yeast: NONE SEEN [HPF]

## 2015-07-13 LAB — PAP, TP IMAGING W/ HPV RNA, RFLX HPV TYPE 16,18/45: HPV mRNA, High Risk: NOT DETECTED

## 2015-07-19 ENCOUNTER — Other Ambulatory Visit: Payer: Self-pay

## 2015-07-19 DIAGNOSIS — Z1231 Encounter for screening mammogram for malignant neoplasm of breast: Secondary | ICD-10-CM

## 2015-08-14 ENCOUNTER — Ambulatory Visit
Admission: RE | Admit: 2015-08-14 | Discharge: 2015-08-14 | Disposition: A | Discharge status: Home / Self Care | Payer: 59 | Source: Ambulatory Visit

## 2015-08-14 DIAGNOSIS — Z1231 Encounter for screening mammogram for malignant neoplasm of breast: Secondary | ICD-10-CM

## 2016-08-27 ENCOUNTER — Other Ambulatory Visit: Payer: Self-pay | Admitting: Family Medicine

## 2016-08-27 DIAGNOSIS — Z1231 Encounter for screening mammogram for malignant neoplasm of breast: Secondary | ICD-10-CM

## 2016-08-30 ENCOUNTER — Ambulatory Visit
Admission: RE | Admit: 2016-08-30 | Discharge: 2016-08-30 | Disposition: A | Discharge status: Home / Self Care | Payer: 59 | Source: Ambulatory Visit | Attending: Family Medicine | Admitting: Family Medicine

## 2016-08-30 DIAGNOSIS — Z1231 Encounter for screening mammogram for malignant neoplasm of breast: Secondary | ICD-10-CM

## 2017-08-19 ENCOUNTER — Other Ambulatory Visit: Payer: Self-pay | Admitting: Family Medicine

## 2017-08-19 DIAGNOSIS — Z1231 Encounter for screening mammogram for malignant neoplasm of breast: Secondary | ICD-10-CM

## 2017-09-17 ENCOUNTER — Ambulatory Visit: Payer: 59

## 2017-09-25 ENCOUNTER — Ambulatory Visit
Admission: RE | Admit: 2017-09-25 | Discharge: 2017-09-25 | Disposition: A | Discharge status: Home / Self Care | Payer: 59 | Source: Ambulatory Visit | Attending: Family Medicine | Admitting: Family Medicine

## 2017-09-25 DIAGNOSIS — Z1231 Encounter for screening mammogram for malignant neoplasm of breast: Secondary | ICD-10-CM

## 2018-10-19 ENCOUNTER — Other Ambulatory Visit: Payer: Self-pay | Admitting: Family Medicine

## 2018-10-19 DIAGNOSIS — Z1231 Encounter for screening mammogram for malignant neoplasm of breast: Secondary | ICD-10-CM

## 2018-11-30 ENCOUNTER — Other Ambulatory Visit: Payer: Self-pay

## 2018-11-30 ENCOUNTER — Ambulatory Visit
Admission: RE | Admit: 2018-11-30 | Discharge: 2018-11-30 | Disposition: A | Discharge status: Home / Self Care | Payer: 59 | Source: Ambulatory Visit | Attending: Family Medicine | Admitting: Family Medicine

## 2018-11-30 DIAGNOSIS — Z1231 Encounter for screening mammogram for malignant neoplasm of breast: Secondary | ICD-10-CM

## 2019-12-20 ENCOUNTER — Other Ambulatory Visit: Payer: Self-pay | Admitting: Family Medicine

## 2019-12-20 DIAGNOSIS — Z1231 Encounter for screening mammogram for malignant neoplasm of breast: Secondary | ICD-10-CM

## 2020-01-13 ENCOUNTER — Ambulatory Visit
Admission: RE | Admit: 2020-01-13 | Discharge: 2020-01-13 | Disposition: A | Discharge status: Home / Self Care | Payer: 59 | Source: Ambulatory Visit | Attending: Family Medicine | Admitting: Family Medicine

## 2020-01-13 ENCOUNTER — Other Ambulatory Visit: Payer: Self-pay

## 2020-01-13 DIAGNOSIS — Z1231 Encounter for screening mammogram for malignant neoplasm of breast: Secondary | ICD-10-CM

## 2020-12-13 ENCOUNTER — Other Ambulatory Visit: Payer: Self-pay | Admitting: Family Medicine

## 2020-12-13 DIAGNOSIS — Z1231 Encounter for screening mammogram for malignant neoplasm of breast: Secondary | ICD-10-CM

## 2021-01-15 ENCOUNTER — Other Ambulatory Visit: Payer: Self-pay

## 2021-01-15 ENCOUNTER — Ambulatory Visit
Admission: RE | Admit: 2021-01-15 | Discharge: 2021-01-15 | Disposition: A | Discharge status: Home / Self Care | Payer: 59 | Source: Ambulatory Visit | Attending: Family Medicine | Admitting: Family Medicine

## 2021-01-15 DIAGNOSIS — Z1231 Encounter for screening mammogram for malignant neoplasm of breast: Secondary | ICD-10-CM

## 2021-12-24 ENCOUNTER — Other Ambulatory Visit: Payer: Self-pay | Admitting: Family Medicine

## 2021-12-24 DIAGNOSIS — Z1231 Encounter for screening mammogram for malignant neoplasm of breast: Secondary | ICD-10-CM

## 2022-02-07 ENCOUNTER — Ambulatory Visit
Admission: RE | Admit: 2022-02-07 | Discharge: 2022-02-07 | Disposition: A | Discharge status: Home / Self Care | Payer: PRIVATE HEALTH INSURANCE | Source: Ambulatory Visit | Attending: Family Medicine | Admitting: Family Medicine

## 2022-02-07 DIAGNOSIS — Z1231 Encounter for screening mammogram for malignant neoplasm of breast: Secondary | ICD-10-CM

## 2022-08-29 ENCOUNTER — Ambulatory Visit (INDEPENDENT_AMBULATORY_CARE_PROVIDER_SITE_OTHER): Payer: Managed Care, Other (non HMO) | Admitting: Family Medicine

## 2022-08-29 ENCOUNTER — Encounter: Payer: Self-pay | Admitting: Family Medicine

## 2022-08-29 VITALS — BP 128/78 | HR 59 | Temp 98.1°F | Ht 63.0 in | Wt 161.4 lb

## 2022-08-29 DIAGNOSIS — G4700 Insomnia, unspecified: Secondary | ICD-10-CM | POA: Insufficient documentation

## 2022-08-29 DIAGNOSIS — E785 Hyperlipidemia, unspecified: Secondary | ICD-10-CM | POA: Insufficient documentation

## 2022-08-29 DIAGNOSIS — R6889 Other general symptoms and signs: Secondary | ICD-10-CM | POA: Diagnosis not present

## 2022-08-29 DIAGNOSIS — Z1211 Encounter for screening for malignant neoplasm of colon: Secondary | ICD-10-CM

## 2022-08-29 DIAGNOSIS — R42 Dizziness and giddiness: Secondary | ICD-10-CM | POA: Diagnosis not present

## 2022-08-29 DIAGNOSIS — Z0001 Encounter for general adult medical examination with abnormal findings: Secondary | ICD-10-CM

## 2022-08-29 DIAGNOSIS — Z131 Encounter for screening for diabetes mellitus: Secondary | ICD-10-CM

## 2022-08-29 NOTE — Progress Notes (Signed)
Chief Complaint:  Latasha Bass is a 62 y.o. female who presents today for her annual comprehensive physical exam.  She is a new patient.   Assessment/Plan:  Chronic Problems Addressed Today: Forgetfulness Potentially age-related cognitive changes however she does have a family history of early onset dementia in her mother who was diagnosed in her 41s.  We will check labs today to look for possible metabolic causes.  If negative will need to be referred to neurology.  She is agreeable to this plan.  Insomnia Stable on trazodone 100 mg nightly.  Dyslipidemia On Zetia 10 g daily.  She is avoiding statins due to concern for cognitive impairment.  We will check lipids.  Discussed lifestyle modifications.  Preventative Healthcare: Check labs.  Will obtain records from previous PCP.  Will order Cologuard today.  She last had Pap in 2021-we can repeat in 2026.  Mammogram is due in November.  Patient Counseling(The following topics were reviewed and/or handout was given):  -Nutrition: Stressed importance of moderation in sodium/caffeine intake, saturated fat and cholesterol, caloric balance, sufficient intake of fresh fruits, vegetables, and fiber.  -Stressed the importance of regular exercise.   -Substance Abuse: Discussed cessation/primary prevention of tobacco, alcohol, or other drug use; driving or other dangerous activities under the influence; availability of treatment for abuse.   -Injury prevention: Discussed safety belts, safety helmets, smoke detector, smoking near bedding or upholstery.   -Sexuality: Discussed sexually transmitted diseases, partner selection, use of condoms, avoidance of unintended pregnancy and contraceptive alternatives.   -Dental health: Discussed importance of regular tooth brushing, flossing, and dental visits.  -Health maintenance and immunizations reviewed. Please refer to Health maintenance section.  Return to care in 1 year for next preventative visit.      Subjective:  HPI:  Patient is here to establish care today.  See A/P for status of chronic conditions.  Her primary concern today is cognitive impairment.  She does have a family history of early onset dementia in her mother who started having symptoms in her 35s.  She has noticed increasing forgetfulness over the last couple of years but this does seem to be getting worse within the last few months.  Mood has been okay.  She sleeps about 5 hours per night.  She does have some issues with insomnia however the trazodone helps treat this.  Patient states that she will frequently go into a room and forget the reason that she was in there.  She also will sometimes forget people's names.  The thoughts do eventually come back to her however this does take longer than what she would like.  Lifestyle Diet: Balanced. Trying to cut down on sweets and carbohydrates.  Exercise: Walks a couple of miles per day. Goes to [employer] 3-4 times per week.      08/29/2022   12:58 PM  Depression screen PHQ 2/9  Decreased Interest 0  Down, Depressed, Hopeless 0  PHQ - 2 Score 0    Health Maintenance Due  Topic Date Due   HIV Screening  Never done   Hepatitis C Screening  Never done   DTaP/Tdap/Td (1 - Tdap) Never done   Fecal DNA (Cologuard)  Never done   PAP SMEAR-Modifier  07/10/2020   Zoster Vaccines- Shingrix (2 of 2) 02/06/2021   COVID-19 Vaccine (8 - 2023-24 season) 11/09/2021     ROS: Per HPI, otherwise a complete review of systems was negative.   PMH:  The following were reviewed and entered/updated in epic:  Past Medical History:  Diagnosis Date   H/O vasectomy    HUSBAND WITH VASECTOMY   Stress incontinence    Patient Active Problem List   Diagnosis Date Noted   Insomnia 08/29/2022   Dyslipidemia 08/29/2022   Forgetfulness 08/29/2022   Vertigo 08/29/2022   History reviewed. No pertinent surgical history.  Family History  Problem Relation Age of Onset   Hypertension Mother     Hypertension Father    Cancer Father        BLADDER   Hypertension Sister    Hyperlipidemia Sister    Breast cancer Neg Hx     Medications- reviewed and updated Current Outpatient Medications  Medication Sig Dispense Refill   ezetimibe (ZETIA) 10 MG tablet Take by mouth.     MECLIZINE HCL PO Take by mouth as needed.     Multiple Vitamin (MULTIVITAMIN) capsule Take 1 capsule by mouth daily.     NON FORMULARY MEDICATION FOR CHOLESTROL 1 TAB PO QD     traZODone (DESYREL) 100 MG tablet Take by mouth.     No current facility-administered medications for this visit.    Allergies-reviewed and updated No Known Allergies  Social History   Socioeconomic History   Marital status: Married    Spouse name: Not on file   Number of children: Not on file   Years of education: Not on file   Highest education level: Not on file  Occupational History   Not on file  Tobacco Use   Smoking status: Never   Smokeless tobacco: Never  Substance and Sexual Activity   Alcohol use: Yes    Comment: SOCIALLY ONLY   Drug use: No   Sexual activity: Yes    Birth control/protection: Post-menopausal  Other Topics Concern   Not on file  Social History Narrative   Not on file   Social Determinants of Health   Financial Resource Strain: Not on file  Food Insecurity: Not on file  Transportation Needs: Not on file  Physical Activity: Not on file  Stress: Not on file  Social Connections: Not on file        Objective:  Physical Exam: BP 128/78   Pulse (!) 59   Temp 98.1 F (36.7 C) (Temporal)   Ht 5\' 3"  (1.6 m)   Wt 161 lb 6.4 oz (73.2 kg)   LMP 08/09/2011   SpO2 99%   BMI 28.59 kg/m   Body mass index is 28.59 kg/m. Wt Readings from Last 3 Encounters:  08/29/22 161 lb 6.4 oz (73.2 kg)  07/11/15 142 lb (64.4 kg)  06/08/12 148 lb 8 oz (67.4 kg)   Gen: NAD, resting comfortably HEENT: TMs normal bilaterally. OP clear. No thyromegaly noted.  CV: RRR with no murmurs  appreciated Pulm: NWOB, CTAB with no crackles, wheezes, or rhonchi GI: Normal bowel sounds present. Soft, Nontender, Nondistended. MSK: no edema, cyanosis, or clubbing noted Skin: warm, dry Neuro: CN2-12 grossly intact. Strength 5/5 in upper and lower extremities. Reflexes symmetric and intact bilaterally.  Psych: Normal affect and thought content     Theron Cumbie M. Jimmey Ralph, MD 08/29/2022 1:31 PM

## 2022-08-29 NOTE — Patient Instructions (Signed)
It was very nice to see you today!  We will check blood work  We may need to send you to see the neurologist depending on results of your blood work.  We will order Cologuard.  You are due for your Pap smear in 2026  Return in about 1 year (around 08/29/2023) for Annual Physical.   Take care, Dr Jimmey Ralph  PLEASE NOTE:  If you had any lab tests, please let us know if you have not heard back within a few days. You may see your results on mychart before we have a chance to review them but we will give you a call once they are reviewed by [place].   If we ordered any referrals today, please let us know if you have not heard from their office within the next week.   If you had any urgent prescriptions sent in today, please check with the pharmacy within an hour of our visit to make sure the prescription was transmitted appropriately.   Please try these tips to maintain a healthy lifestyle:  Eat at least 3 REAL meals and 1-2 snacks per day.  Aim for no more than 5 hours between eating.  If you eat breakfast, please do so within one hour of getting up.   Each meal should contain half fruits/vegetables, one quarter protein, and one quarter carbs (no bigger than a computer mouse)  Cut down on sweet beverages. This includes juice, soda, and sweet tea.   Drink at least 1 glass of water with each meal and aim for at least 8 glasses per day  Exercise at least 150 minutes every week.    Preventive Care 62-14 Years Old, Female Preventive care refers to lifestyle choices and visits with your health care provider that can promote health and wellness. Preventive care visits are also called wellness exams. What can I expect for my preventive care visit? Counseling Your health care provider may ask you questions about your: Medical history, including: Past medical problems. Family medical history. Pregnancy history. Current health, including: Menstrual cycle. Method of birth control. Emotional  well-being. Home life and relationship well-being. Sexual activity and sexual health. Lifestyle, including: Alcohol, nicotine or tobacco, and drug use. Access to firearms. Diet, exercise, and sleep habits. Work and work Astronomer. Sunscreen use. Safety issues such as seatbelt and bike helmet use. Physical exam Your health care provider will check your: Height and weight. These may be used to calculate your BMI (body mass index). BMI is a measurement that tells if you are at a healthy weight. Waist circumference. This measures the distance around your waistline. This measurement also tells if you are at a healthy weight and may help predict your risk of certain diseases, such as type 2 diabetes and high blood pressure. Heart rate and blood pressure. Body temperature. Skin for abnormal spots. What immunizations do I need?  Vaccines are usually given at various ages, according to a schedule. Your health care provider will recommend vaccines for you based on your age, medical history, and lifestyle or other factors, such as travel or where you work. What tests do I need? Screening Your health care provider may recommend screening tests for certain conditions. This may include: Lipid and cholesterol levels. Diabetes screening. This is done by checking your blood sugar (glucose) after you have not eaten for a while (fasting). Pelvic exam and Pap test. Hepatitis B test. Hepatitis C test. HIV (human immunodeficiency virus) test. STI (sexually transmitted infection) testing, if you are at risk.  Lung cancer screening. Colorectal cancer screening. Mammogram. Talk with your health care provider about when you should start having regular mammograms. This may depend on whether you have a family history of breast cancer. BRCA-related cancer screening. This may be done if you have a family history of breast, ovarian, tubal, or peritoneal cancers. Bone density scan. This is done to screen for  osteoporosis. Talk with your health care provider about your test results, treatment options, and if necessary, the need for more tests. Follow these instructions at home: Eating and drinking  Eat a diet that includes fresh fruits and vegetables, whole grains, lean protein, and low-fat dairy products. Take vitamin and mineral supplements as recommended by your health care provider. Do not drink alcohol if: Your health care provider tells you not to drink. You are pregnant, may be pregnant, or are planning to become pregnant. If you drink alcohol: Limit how much you have to 0-1 drink a day. Know how much alcohol is in your drink. In the [place] one drink equals one 12 oz bottle of beer (355 mL), one 5 oz glass of wine (148 mL), or one 1 oz glass of hard liquor (44 mL). Lifestyle Brush your teeth every morning and night with fluoride toothpaste. Floss one time each day. Exercise for at least 30 minutes 5 or more days each week. Do not use any products that contain nicotine or tobacco. These products include cigarettes, chewing tobacco, and vaping devices, such as e-cigarettes. If you need help quitting, ask your health care provider. Do not use drugs. If you are sexually active, practice safe sex. Use a condom or other form of protection to prevent STIs. If you do not wish to become pregnant, use a form of birth control. If you plan to become pregnant, see your health care provider for a prepregnancy visit. Take aspirin only as told by your health care provider. Make sure that you understand how much to take and what form to take. Work with your health care provider to find out whether it is safe and beneficial for you to take aspirin daily. Find healthy ways to manage stress, such as: Meditation, yoga, or listening to music. Journaling. Talking to a trusted person. Spending time with friends and family. Minimize exposure to UV radiation to reduce your risk of skin cancer. Safety Always wear  your seat belt while driving or riding in a vehicle. Do not drive: If you have been drinking alcohol. Do not ride with someone who has been drinking. When you are tired or distracted. While texting. If you have been using any mind-altering substances or drugs. Wear a helmet and other protective equipment during sports activities. If you have firearms in your house, make sure you follow all gun safety procedures. Seek help if you have been physically or sexually abused. What's next? Visit your health care provider once a year for an annual wellness visit. Ask your health care provider how often you should have your eyes and teeth checked. Stay up to date on all vaccines. This information is not intended to replace advice given to you by your health care provider. Make sure you discuss any questions you have with your health care provider. Document Revised: 08/23/2020 Document Reviewed: 08/23/2020 Elsevier Patient Education  2024 [employer].

## 2022-08-29 NOTE — Assessment & Plan Note (Signed)
Stable on trazodone 100 mg nightly. 

## 2022-08-29 NOTE — Assessment & Plan Note (Signed)
Potentially age-related cognitive changes however she does have a family history of early onset dementia in her mother who was diagnosed in her 16s.  We will check labs today to look for possible metabolic causes.  If negative will need to be referred to neurology.  She is agreeable to this plan.

## 2022-08-29 NOTE — Assessment & Plan Note (Signed)
On Zetia 10 g daily.  She is avoiding statins due to concern for cognitive impairment.  We will check lipids.  Discussed lifestyle modifications.

## 2022-09-06 ENCOUNTER — Other Ambulatory Visit (INDEPENDENT_AMBULATORY_CARE_PROVIDER_SITE_OTHER): Payer: Managed Care, Other (non HMO)

## 2022-09-06 DIAGNOSIS — R6889 Other general symptoms and signs: Secondary | ICD-10-CM | POA: Diagnosis not present

## 2022-09-06 DIAGNOSIS — Z0001 Encounter for general adult medical examination with abnormal findings: Secondary | ICD-10-CM

## 2022-09-06 DIAGNOSIS — Z131 Encounter for screening for diabetes mellitus: Secondary | ICD-10-CM | POA: Diagnosis not present

## 2022-09-06 DIAGNOSIS — E785 Hyperlipidemia, unspecified: Secondary | ICD-10-CM | POA: Diagnosis not present

## 2022-09-06 LAB — COMPREHENSIVE METABOLIC PANEL
ALT: 21 U/L (ref 0–35)
AST: 21 U/L (ref 0–37)
Albumin: 4.1 g/dL (ref 3.5–5.2)
Alkaline Phosphatase: 84 U/L (ref 39–117)
BUN: 17 mg/dL (ref 6–23)
CO2: 31 mEq/L (ref 19–32)
Calcium: 9.4 mg/dL (ref 8.4–10.5)
Chloride: 102 mEq/L (ref 96–112)
Creatinine, Ser: 0.75 mg/dL (ref 0.40–1.20)
GFR: 85.43 mL/min (ref >60.00)
Glucose, Bld: 97 mg/dL (ref 70–99)
Potassium: 4.2 mEq/L (ref 3.5–5.1)
Sodium: 138 mEq/L (ref 135–145)
Total Bilirubin: 0.5 mg/dL (ref 0.2–1.2)
Total Protein: 6.5 g/dL (ref 6.0–8.3)

## 2022-09-06 LAB — LIPID PANEL
Cholesterol: 225 mg/dL — ABNORMAL HIGH (ref 0–200)
HDL: 77.2 mg/dL (ref >39.00)
LDL Cholesterol: 128 mg/dL — ABNORMAL HIGH (ref 0–99)
NonHDL: 147.68
Total CHOL/HDL Ratio: 3
Triglycerides: 99 mg/dL (ref 0.0–149.0)
VLDL: 19.8 mg/dL (ref 0.0–40.0)

## 2022-09-06 LAB — CBC
HCT: 40.1 % (ref 36.0–46.0)
Hemoglobin: 13.3 g/dL (ref 12.0–15.0)
MCHC: 33.1 g/dL (ref 30.0–36.0)
MCV: 87 fl (ref 78.0–100.0)
Platelets: 263 10*3/uL (ref 150.0–400.0)
RBC: 4.6 Mil/uL (ref 3.87–5.11)
RDW: 13.9 % (ref 11.5–15.5)
WBC: 3.8 10*3/uL — ABNORMAL LOW (ref 4.0–10.5)

## 2022-09-06 LAB — TSH: TSH: 1.18 u[IU]/mL (ref 0.35–5.50)

## 2022-09-06 LAB — HEMOGLOBIN A1C: Hgb A1c MFr Bld: 5.7 % (ref 4.6–6.5)

## 2022-09-06 LAB — VITAMIN B12: Vitamin B-12: 445 pg/mL (ref 211–911)

## 2022-09-06 NOTE — Progress Notes (Signed)
Cholesterol is borderline elevated.  Do not need to start meds but she should continue to work on diet and exercise and we can recheck this in year or so.  The rest of her labs are all within acceptable ranges.  No abnormalities that would contribute to any forgetfulness or cognitive change.  Recommend referral to neurology as we discussed at her office visit.  We can recheck everything in a year.

## 2022-09-10 ENCOUNTER — Other Ambulatory Visit: Payer: Self-pay | Admitting: *Deleted

## 2022-09-10 DIAGNOSIS — R6889 Other general symptoms and signs: Secondary | ICD-10-CM

## 2022-09-13 LAB — COLOGUARD: COLOGUARD: NEGATIVE

## 2022-09-16 NOTE — Progress Notes (Signed)
Great news!  Her Cologuard is negative.  We can repeat in 3 years.

## 2022-11-04 ENCOUNTER — Telehealth: Payer: Self-pay | Admitting: Family Medicine

## 2022-11-04 MED ORDER — TRAZODONE HCL 100 MG PO TABS: 100.0000 mg | ORAL_TABLET | Freq: Every day | ORAL | 0 refills | Status: DC

## 2022-11-04 NOTE — Telephone Encounter (Signed)
Prescription Request  11/04/2022  LOV: 08/29/2022  What is the name of the medication or equipment? traZODone (DESYREL) 100 MG tablet   Have you contacted your pharmacy to request a refill? Yes   Which pharmacy would you like this sent to?  [facility] [id number] [place],  - [address] [place] [address] Phone: [phone] Fax: [phone]    Patient notified that their request is being sent to the clinical staff for review and that they should receive a response within 2 business days.   Please advise at Mobile [phone] (mobile)

## 2022-11-04 NOTE — Telephone Encounter (Signed)
Spoke to pt told her Rx for Trazodone was sent to pharmacy as requested. Pt verbalized understanding.

## 2022-11-05 NOTE — Telephone Encounter (Signed)
Patient states medication refill was suppose to be for trazodone 100 mg TWICE DAILY instead of once. Patient is also wanting to know if she would have to call in every time she needs a refill for this or if enough can be sent in to last until CPE in January.

## 2022-11-06 ENCOUNTER — Encounter: Payer: Self-pay | Admitting: Family Medicine

## 2022-11-06 NOTE — Telephone Encounter (Signed)
Please advise 

## 2022-11-07 ENCOUNTER — Other Ambulatory Visit: Payer: Self-pay | Admitting: *Deleted

## 2022-11-07 MED ORDER — TRAZODONE HCL 100 MG PO TABS: 100.0000 mg | ORAL_TABLET | Freq: Two times a day (BID) | ORAL | 3 refills | Status: DC

## 2022-11-07 NOTE — Telephone Encounter (Signed)
Please see phone note. Ok to send in dispense #90 with 3 refills.  [name]. Jimmey Ralph, MD 11/07/2022 3:27 PM

## 2022-11-07 NOTE — Telephone Encounter (Signed)
Per Dr Jimmey Ralph ok to send Rx Trazodone BID #180 3 refills  Rx send to [facility]

## 2022-11-07 NOTE — Telephone Encounter (Signed)
Please advise 

## 2022-11-07 NOTE — Telephone Encounter (Signed)
Ok to send in dispense #90 with 3 refills.  [name]. Jimmey Ralph, MD 11/07/2022 9:46 AM

## 2022-11-08 ENCOUNTER — Other Ambulatory Visit: Payer: Self-pay | Admitting: *Deleted

## 2023-01-22 ENCOUNTER — Other Ambulatory Visit: Payer: Self-pay | Admitting: Family Medicine

## 2023-01-22 DIAGNOSIS — Z1231 Encounter for screening mammogram for malignant neoplasm of breast: Secondary | ICD-10-CM

## 2023-02-10 ENCOUNTER — Encounter: Payer: Self-pay | Admitting: Family Medicine

## 2023-02-18 ENCOUNTER — Ambulatory Visit
Admission: RE | Admit: 2023-02-18 | Discharge: 2023-02-18 | Disposition: A | Discharge status: Home / Self Care | Payer: Managed Care, Other (non HMO) | Source: Ambulatory Visit | Attending: Family Medicine | Admitting: Family Medicine

## 2023-02-18 DIAGNOSIS — Z1231 Encounter for screening mammogram for malignant neoplasm of breast: Secondary | ICD-10-CM

## 2023-03-10 ENCOUNTER — Encounter: Payer: Self-pay | Admitting: Family Medicine

## 2023-03-10 ENCOUNTER — Telehealth: Payer: Self-pay | Admitting: *Deleted

## 2023-03-10 ENCOUNTER — Ambulatory Visit (INDEPENDENT_AMBULATORY_CARE_PROVIDER_SITE_OTHER): Payer: Managed Care, Other (non HMO) | Admitting: Family Medicine

## 2023-03-10 ENCOUNTER — Other Ambulatory Visit: Payer: Self-pay | Admitting: *Deleted

## 2023-03-10 VITALS — BP 120/78 | HR 75 | Temp 97.6°F | Ht 63.0 in | Wt 166.6 lb

## 2023-03-10 DIAGNOSIS — R350 Frequency of micturition: Secondary | ICD-10-CM | POA: Diagnosis not present

## 2023-03-10 DIAGNOSIS — N3281 Overactive bladder: Secondary | ICD-10-CM | POA: Insufficient documentation

## 2023-03-10 LAB — POCT URINALYSIS DIPSTICK
Bilirubin, UA: NEGATIVE
Blood, UA: NEGATIVE
Glucose, UA: NEGATIVE
Ketones, UA: NEGATIVE
Leukocytes, UA: NEGATIVE
Nitrite, UA: NEGATIVE
Protein, UA: NEGATIVE
Spec Grav, UA: >=1.03 — AB (ref 1.010–1.025)
Urobilinogen, UA: 0.2 U/dL
pH, UA: 5 (ref 5.0–8.0)

## 2023-03-10 MED ORDER — MIRABEGRON ER 25 MG PO TB24: 25.0000 mg | ORAL_TABLET | Freq: Every day | ORAL | 5 refills | Status: DC

## 2023-03-10 MED ORDER — GEMTESA 75 MG PO TABS: 75.0000 mg | ORAL_TABLET | Freq: Every day | ORAL | 1 refills | Status: DC

## 2023-03-10 NOTE — Patient Instructions (Signed)
It was very nice to see you today!  You have overactive bladder.  Please try the Myrbetriq.  I will refer you to see the urogynecologist.  Send me a message in a few weeks to let us know how you are doing.  Return if symptoms worsen or fail to improve.   Take care, Dr Jimmey Ralph  PLEASE NOTE:  If you had any lab tests, please let us know if you have not heard back within a few days. You may see your results on mychart before we have a chance to review them but we will give you a call once they are reviewed by [place].   If we ordered any referrals today, please let us know if you have not heard from their office within the next week.   If you had any urgent prescriptions sent in today, please check with the pharmacy within an hour of our visit to make sure the prescription was transmitted appropriately.   Please try these tips to maintain a healthy lifestyle:  Eat at least 3 REAL meals and 1-2 snacks per day.  Aim for no more than 5 hours between eating.  If you eat breakfast, please do so within one hour of getting up.   Each meal should contain half fruits/vegetables, one quarter protein, and one quarter carbs (no bigger than a computer mouse)  Cut down on sweet beverages. This includes juice, soda, and sweet tea.   Drink at least 1 glass of water with each meal and aim for at least 8 glasses per day  Exercise at least 150 minutes every week.

## 2023-03-10 NOTE — Telephone Encounter (Signed)
We can send in gemtesa 75 mg daily as an alternative though recommend she check with pharmacy to see what is covered.

## 2023-03-10 NOTE — Assessment & Plan Note (Signed)
UA today normal.  Urine culture is pending though based on symptoms does not likely have a UTI.  She likely does have combination of OAB as well as stress incontinence.  She is interested in seeing a urologist.  Will place referral to urogynecology.  We also discussed medication treatment options.  Will try Myrbetriq 25 mg daily.  Discussed potential side effects.  We also did discuss pelvic floor rehab however will defer this to urology.  She will send use a message in a few weeks to let us know how she is doing.

## 2023-03-10 NOTE — Telephone Encounter (Signed)
Rx send to pharmacy, Patient notified

## 2023-03-10 NOTE — Progress Notes (Signed)
   Latasha Bass is a 62 y.o. female who presents today for an office visit.  Assessment/Plan:  Chronic Problems Addressed Today: Overactive bladder UA today normal.  Urine culture is pending though based on symptoms does not likely have a UTI.  She likely does have combination of OAB as well as stress incontinence.  She is interested in seeing a urologist.  Will place referral to urogynecology.  We also discussed medication treatment options.  Will try Myrbetriq 25 mg daily.  Discussed potential side effects.  We also did discuss pelvic floor rehab however will defer this to urology.  She will send use a message in a few weeks to let us know how she is doing.    Subjective:  HPI:  See A/P for status of chronic conditions.  Patient is here today with urinary frequency.  This has been going on for a couple of years. Getting worse. She does occasionally have some leakage as well. She gets leakage with walking, running. No dysuria. No hematuria. Waking up about 4 times per night to urinate.  She did see urology for this a couple of years ago.  They were planning on doing a sling procedure however she opted out of this.  She has not followed with them since.       Objective:  Physical Exam: BP 120/78   Pulse 75   Temp 97.6 F (36.4 C) (Temporal)   Ht 5\' 3"  (1.6 m)   Wt 166 lb 9.6 oz (75.6 kg)   LMP 08/09/2011   SpO2 98%   BMI 29.51 kg/m   Gen: No acute distress, resting comfortably Neuro: Grossly normal, moves all extremities Psych: Normal affect and thought content      Ji Fairburn M. Jimmey Ralph, MD 03/10/2023 8:44 AM

## 2023-03-10 NOTE — Telephone Encounter (Signed)
Copied from CRM [id number]. Topic: Clinical - Prescription Issue >> Mar 10, 2023  9:45 AM Prudencio Pair wrote: Reason for CRM: Patient states Dr. Jimmey Ralph sent a prescription for Myrbetriq 25 mg & told her it may or may not be covered. Pt states pharmacy stated it is not covered & will cost her about $80. She stated that Dr. Jimmey Ralph told her if it wasn't covered, then he would send in an alternative. Pt would like for whatever the alternative is to be sent to pharmacy. Please give pt a call back. CB #: [phone].   Please advise  Lavenia Stumpo,RMA

## 2023-03-11 LAB — URINE CULTURE
MICRO NUMBER:: 15900067
Result:: NO GROWTH
SPECIMEN QUALITY:: ADEQUATE

## 2023-03-11 NOTE — Telephone Encounter (Signed)
Please advise 

## 2023-03-11 NOTE — Telephone Encounter (Signed)
 We sent in Myrbetriq and then [name].  If neither is approved we can try extended release oxybutynin 5 mg daily.  Please send a prescription if needed.   [name]. Jimmey Ralph, MD 03/11/2023 12:27 PM

## 2023-03-14 NOTE — Telephone Encounter (Signed)
 Copied from CRM [id number]. Topic: Clinical - Prescription Issue >> Mar 10, 2023  9:45 AM Susanna ORN wrote: Reason for CRM: Patient states Dr. Kennyth sent a prescription for Myrbetriq  25 mg & told her it may or may not be covered. Pt states pharmacy stated it is not covered & will cost her about $80. She stated that Dr. Kennyth told her if it wasn't covered, then he would send in an alternative. Pt would like for whatever the alternative is to be sent to pharmacy. Please give pt a call back. CB #: [phone].

## 2023-03-17 ENCOUNTER — Other Ambulatory Visit: Payer: Self-pay | Admitting: *Deleted

## 2023-03-17 MED ORDER — MIRABEGRON ER 25 MG PO TB24: 25.0000 mg | ORAL_TABLET | Freq: Every day | ORAL | 5 refills | Status: DC

## 2023-03-19 ENCOUNTER — Encounter: Payer: Self-pay | Admitting: Family Medicine

## 2023-03-19 NOTE — Telephone Encounter (Signed)
 Copied from CRM [id number]. Topic: General - Other >> Mar 19, 2023  1:16 PM Mercedes MATSU wrote: Reason for CRM: Patient called in saying she had a missed call, no message was left asking for a return call from the office.  Date of birth verified by patient  Lab results given,Pt verbalized understanding  Adonia Porada,RMA

## 2023-04-18 ENCOUNTER — Encounter: Payer: Self-pay | Admitting: Family Medicine

## 2023-04-18 DIAGNOSIS — Z124 Encounter for screening for malignant neoplasm of cervix: Secondary | ICD-10-CM

## 2023-04-25 NOTE — Telephone Encounter (Signed)
She can try doubling the dose prior to her appointment with OBGYN to see if this would help.  [name]. Jimmey Ralph, MD 04/25/2023 12:29 PM

## 2023-04-25 NOTE — Telephone Encounter (Signed)
Spoke with patient, stated Rx flomax helping with leakage but still with frequency at night  Has an appt with Urology/ GYN next month

## 2023-06-03 ENCOUNTER — Ambulatory Visit: Payer: Managed Care, Other (non HMO) | Admitting: Obstetrics

## 2023-06-03 ENCOUNTER — Encounter: Payer: Self-pay | Admitting: Obstetrics

## 2023-06-03 ENCOUNTER — Ambulatory Visit (INDEPENDENT_AMBULATORY_CARE_PROVIDER_SITE_OTHER): Payer: Managed Care, Other (non HMO) | Admitting: Obstetrics

## 2023-06-03 VITALS — BP 152/91 | HR 80 | Ht 62.5 in | Wt 167.0 lb

## 2023-06-03 DIAGNOSIS — N393 Stress incontinence (female) (male): Secondary | ICD-10-CM

## 2023-06-03 DIAGNOSIS — R351 Nocturia: Secondary | ICD-10-CM | POA: Diagnosis not present

## 2023-06-03 DIAGNOSIS — N329 Bladder disorder, unspecified: Secondary | ICD-10-CM | POA: Insufficient documentation

## 2023-06-03 DIAGNOSIS — N952 Postmenopausal atrophic vaginitis: Secondary | ICD-10-CM | POA: Insufficient documentation

## 2023-06-03 DIAGNOSIS — N3281 Overactive bladder: Secondary | ICD-10-CM | POA: Diagnosis not present

## 2023-06-03 DIAGNOSIS — N905 Atrophy of vulva: Secondary | ICD-10-CM

## 2023-06-03 LAB — POCT URINALYSIS DIPSTICK
Bilirubin, UA: NEGATIVE
Blood, UA: NEGATIVE
Glucose, UA: NEGATIVE
Ketones, UA: NEGATIVE
Leukocytes, UA: NEGATIVE
Nitrite, UA: NEGATIVE
Protein, UA: NEGATIVE
Spec Grav, UA: 1.02 (ref 1.010–1.025)
Urobilinogen, UA: 0.2 U/dL
pH, UA: 5.5 (ref 5.0–8.0)

## 2023-06-03 MED ORDER — GEMTESA 75 MG PO TABS
75.0000 mg | ORAL_TABLET | Freq: Every day | ORAL | 0 refills | Status: DC
Start: 2023-06-03 — End: 2023-12-15

## 2023-06-03 MED ORDER — GEMTESA 75 MG PO TABS: 75.0000 mg | ORAL_TABLET | Freq: Every day | ORAL | 2 refills | Status: DC

## 2023-06-03 MED ORDER — ESTRADIOL 0.1 MG/GM VA CREA: TOPICAL_CREAM | VAGINAL | 3 refills | Status: AC

## 2023-06-03 NOTE — Assessment & Plan Note (Signed)
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

## 2023-06-03 NOTE — Assessment & Plan Note (Addendum)
-   negative CST on exam, PVR 2mL - will need positive CST prior to intervention or repeat UDS - UDS 07/31/20 with [facility] and no SUI at [facility] 350 mL. "PVR elevated at 153 mL. No instability noted. Voluntary contraction demonstrated." - For treatment of stress urinary incontinence,  non-surgical options include expectant management, weight loss, physical therapy, as well as a pessary.  Surgical options include a midurethral sling, Burch urethropexy, and transurethral injection of a bulking agent. - reviewed office procedure with urethral bulking (Bulkamid). We discussed success rate of approximately 70-80% and possible need for second injection. We reviewed that this is not a permanent procedure and the Bulkamid does dissolve over time. Risks reviewed including injury to bladder or urethra, UTI, urinary retention and hematuria.  Sling: The effectiveness of a midurethral vaginal mesh sling is approximately 85%, and thus, there will be times when you may leak urine after surgery, especially if your bladder is full or if you have a strong cough. There is a balance between making the sling tight enough to treat your leakage but not too tight so that you have long-term difficulty emptying your bladder. A mesh sling will not directly treat overactive bladder/urge incontinence and may worsen it.  There is an FDA safety notification on vaginal mesh procedures for prolapse but NOT mesh slings. We have extensive experience and training with mesh placement and we have close postoperative follow up to identify any potential complications from mesh. It is important to realize that this mesh is a permanent implant that cannot be easily removed. There are rare risks of mesh exposure (2-4%), pain with intercourse (0-7%), and infection (<1%). The risk of mesh exposure if more likely in a woman with risks for poor healing (prior radiation, poorly controlled diabetes, or immunocompromised). The risk of new or worsened  chronic pain after mesh implant is more common in women with baseline chronic pain and/or poorly controlled anxiety or depression. Approximately 2-4% of patients will experience longer-term post-operative voiding dysfunction that may require surgical revision of the sling. We also reviewed that postoperatively, her stream may not be as strong as before surgery.  - patient previously declined sling due to cost, CPT provided to patient to review with insurance regarding out-of-pocket cost since it was patient's primary concern - start trial of low dose vaginal estrogen - continue kegel exercises - encouraged weight reduction

## 2023-06-03 NOTE — Patient Instructions (Addendum)
 We discussed the symptoms of overactive bladder (OAB), which include urinary urgency, urinary frequency, night-time urination, with or without urge incontinence.  We discussed management including behavioral therapy (decreasing bladder irritants by following a bladder diet, urge suppression strategies, timed voids, bladder retraining), physical therapy, medication; and for refractory cases posterior tibial nerve stimulation, sacral neuromodulation, and intravesical botulinum toxin injection.   For Beta-3 agonist medication, we discussed the potential side effect of elevated blood pressure which is more likely to occur in individuals with uncontrolled hypertension. You were given samples for Myrbetriq 75 mg.  It can take a month to start working so give it time, but if you have bothersome side effects call sooner and we can try a different medication.  Call us if you have trouble filling the prescription or if it's not covered by your insurance.  For treatment of stress urinary incontinence, which is leakage with physical activity/movement/strainging/coughing, we discussed expectant management versus nonsurgical options versus surgery. Nonsurgical options include weight loss, physical therapy, as well as a pessary.  Surgical options include a midurethral sling, which is a synthetic mesh sling that acts like a hammock under the urethra to prevent leakage of urine, a Burch urethropexy, and transurethral injection of a bulking agent.   For night time frequency: - avoid fluid intake 3 hours before bedtime - elevated your feet during the day or use compression socks to reduce lower extremity swelling  For vaginal atrophy (thinning of the vaginal tissue that can cause dryness and burning) and UTI prevention we discussed estrogen replacement in the form of vaginal cream.   Start vaginal estrogen therapy nightly for two weeks then 2 times weekly at night. This can be placed with your finger or an applicator  inside the vagina and around the urethra.  Please let us know if the prescription is too expensive and we can look for alternative options.   Is vaginal estrogen therapy safe for me? Vaginal estrogen preparations act on the vaginal skin, and only a very tiny amount is absorbed into the bloodstream (0.01%).  They work in a similar way to hand or face cream.  There is minimal absorption and they are therefore perfectly safe. If you have had breast cancer and have persistent troublesome symptoms which aren't settling with vaginal moisturisers and lubricants, local estrogen treatment may be a possibility, but consultation with your oncologist should take place first.   Trial of IBGuard for bowel frequency.   Please return for cystoscopy.

## 2023-06-03 NOTE — Assessment & Plan Note (Signed)
-   avoid fluid intake 3 hours before bedtime - due to snoring, consider workup for sleep apnea - trial of gemtesa

## 2023-06-03 NOTE — Assessment & Plan Note (Signed)
-   For symptomatic vaginal atrophy options include lubrication with a water-based lubricant, personal hygiene measures and barrier protection against wetness, and estrogen replacement in the form of vaginal cream, vaginal tablets, or a time-released vaginal ring.   - Rx vaginal estrogen

## 2023-06-03 NOTE — Progress Notes (Signed)
 New Patient Evaluation and Consultation  Referring Provider: Ardith Dark, MD PCP: Ardith Dark, MD Date of Service: 06/03/2023  SUBJECTIVE Chief Complaint: New Patient (Initial Visit) (Latasha Bass is a 63 y.o. female here today for urinary frequency and leakage)  History of Present Illness: Latasha Bass is a 63 y.o. White or Caucasian female seen in consultation at the request of Dr Jimmey Ralph for evaluation of urinary frequency and incontinence.    Overactive bladder treated with mirabegron 25mg  started by Dr. Jimmey Ralph (PCP) Evaluated by Dr. Vernon Prey (urology) in 2020 for mixed urinary incontinence, nocturia, vaginal atrophy, recommended kegels and Premarin. Offered midurethral sling and patient declined due to discomfort and cost. Started mirabegron 50mg  by Dr. Sabino Gasser in 2022 with relief of urinary leakage with reduction leak volume on pad. 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  No SUI was demonstrated. Maximum bladder capacity 350 mL. PVR elevated at 153 mL. No instability noted. Voluntary contraction demonstrated." Reports history of forgetfulness   Review of records significant for: Vertigo  Urinary Symptoms: Leaks urine with cough/ sneeze, laughing, exercise, and with urgency, walking or running started 5 years ago Leaks 2-3 time(s) per days with walking and exercises  Denies leakage with urgency Pad use: 1 pads per day.   Patient is bothered by UI symptoms.  Day time voids 12/day worsen since 4s.  Nocturia: 4-5 times per night to void with insomnia Bladder wakes her up from sleep Drinks 1 glass of wine at dinner and right before bedtime with med intake Reports snoring per husband Denies LE  edema Voiding dysfunction:  empties bladder well.  Patient does not use a catheter to empty bladder.  When urinating, patient feels the need to urinate multiple times in a row Drinks: 32oz water per day, 32-40 oz of 2 cups of coffee, 1 glass of wine at night   UTIs:  0  UTI's in the last year.   Denies history of blood in urine, kidney or bladder stones, pyelonephritis, bladder cancer, and kidney cancer No results found for the last 90 days.   Pelvic Organ Prolapse Symptoms:                  Patient Denies a feeling of a bulge the vaginal area.   Bowel Symptom: Bowel movements: 3-4 time(s) per day started 3 years ago Stool consistency: loose Straining: no.  Splinting: no.  Incomplete evacuation: no.  Patient Denies accidental bowel leakage / fecal incontinence Bowel regimen:  imodium PRN 2-3x/month Last colonoscopy: Date 2016 per chart review, no report for review. Cologuard negative in 09/07/2022 [facility]   This patient has no relevant Health Maintenance data.     Sexual Function Sexually active: no.  Sexual orientation: Straight Pain with sex: No  Pelvic Pain Denies pelvic pain   Past Medical History:  Past Medical History:  Diagnosis Date   H/O vasectomy    HUSBAND WITH VASECTOMY   Stress incontinence      Past Surgical History:  History reviewed. No pertinent surgical history.   Past OB/GYN History: OB History  Gravida Para Term Preterm AB Living  3 1 1  2 1   SAB IAB Ectopic Multiple Live Births      1    #  Outcome Date GA Lbr Len/2nd Weight Sex Type Anes PTL Lv  3 AB           2 AB              Complications: Tubal abortion  1 Term     M Vag-Spont   LIV    Vaginal deliveries: 7lbs,  Forceps/ Vacuum deliveries: 0, Cesarean section: 0 Menopausal: Yes, at age 49, Denies vaginal bleeding since menopause Contraception: s/p menopause, partner underwent vasectomy. Last pap smear was 10/18/19 per chart review.  Any history of abnormal pap smears:  no. No results found for: "DIAGPAP", "HPVHIGH", "ADEQPAP"  Medications: Patient has a current medication list which includes the following prescription(s): [START ON 06/05/2023] estradiol, ezetimibe, meclizine hcl, mirabegron er, multivitamin, NON FORMULARY, trazodone, gemtesa, and gemtesa.   Allergies: Patient has no known allergies.   Social History:  Social History   Tobacco Use   Smoking status: Never   Smokeless tobacco: Never  Vaping Use   Vaping status: Never Used  Substance Use Topics   Alcohol use: Yes    Comment: SOCIALLY ONLY   Drug use: No    Relationship status: married Patient lives with her husband.   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 Regular exercise: Yes: walks, runs, bikes, hikes, and zumba History of abuse: No  Family History:   Family History  Problem Relation Age of Onset   Hypertension Mother    Hypertension Father    Prostate cancer Father    Hypertension Sister    Hyperlipidemia Sister    Breast cancer Neg Hx    Uterine cancer Neg Hx      Review of Systems: Review of Systems  Constitutional:  Negative for fever, malaise/fatigue and weight loss.       Weight gain  Respiratory:  Negative for cough, shortness of breath and wheezing.   Cardiovascular:  Negative for chest pain, palpitations and leg swelling.  Gastrointestinal:  Negative for abdominal pain, blood in stool and constipation.       Frequency  Genitourinary:  Positive for frequency and urgency. Negative for dysuria and hematuria.       Leakage  Skin:  Negative for rash.  Neurological:  Negative for dizziness, weakness and headaches.  Endo/Heme/Allergies:  Bruises/bleeds easily.  Psychiatric/Behavioral:  Negative for depression. The patient is not nervous/anxious.      OBJECTIVE Physical Exam: Vitals:   06/03/23 1236  BP: (!) 152/91  Pulse: 80  Weight: 167 lb (75.8 kg)  Height: 5' 2.5" (1.588 m)    Physical Exam Constitutional:      General: She is  not in acute distress.    Appearance: Normal appearance.  Genitourinary:     Bladder and urethral meatus normal.     No lesions in the vagina.     Right Labia: No rash, tenderness, lesions, skin changes or Bartholin's cyst.    Left Labia: No tenderness, lesions, skin changes, Bartholin's cyst or rash.    No vaginal discharge, erythema, tenderness, bleeding, ulceration or granulation tissue.     Mild vaginal atrophy present.     Right Adnexa: not tender, not full and no mass present.    Left Adnexa: not tender, not full and no mass present.    No cervical motion tenderness, discharge, friability, lesion, polyp or nabothian cyst.     Uterus is not enlarged, fixed, tender, irregular or prolapsed.     No uterine mass detected.    Urethral meatus caruncle not present.  No urethral prolapse, tenderness, mass, hypermobility, discharge or stress urinary incontinence with cough stress test present.     Bladder is not tender, urgency on palpation not present and masses not present.      Pelvic Floor: Levator muscle strength is 3/5.    Levator ani not tender, obturator internus not tender, no asymmetrical contractions present and no pelvic spasms present.    Anal wink present and BC reflex present. Cardiovascular:     Rate and Rhythm: Normal rate.  Pulmonary:     Effort: Pulmonary effort is normal. No respiratory distress.  Abdominal:     General: There is no distension.     Palpations: Abdomen is soft. There is no mass.     Tenderness: There is no abdominal tenderness.     Hernia: No hernia is present.  Neurological:     Mental Status: She is alert.  Vitals reviewed. Exam conducted with a chaperone present.      POP-Q:   POP-Q  -3                                            Aa   -3                                           Ba  -6                                              C   1                                            Gh  4                                            Pb   8                                            tvl   -2                                            Ap  -2                                            Bp  -7                                              D    Post-Void Residual (PVR) by Bladder Scan:  In order to evaluate bladder emptying, we discussed obtaining a postvoid residual and patient agreed to this procedure.  Procedure: The ultrasound unit was placed on the patient's abdomen in the suprapubic region after the patient had voided.    Post Void Residual - 06/03/23 1248       Post Void Residual   Post Void Residual 2 mL              Laboratory Results: Lab Results  Component Value Date   COLORU Yellow 06/03/2023   CLARITYU Clear 06/03/2023   GLUCOSEUR Negative 06/03/2023   BILIRUBINUR Negative 06/03/2023   KETONESU Negative 06/03/2023   SPECGRAV 1.020 06/03/2023   RBCUR Negative 06/03/2023   PHUR 5.5 06/03/2023   PROTEINUR Negative 06/03/2023   UROBILINOGEN 0.2 06/03/2023   LEUKOCYTESUR Negative 06/03/2023    Lab Results  Component Value Date   CREATININE 0.75 09/06/2022    Lab Results  Component Value Date   HGBA1C 5.7 09/06/2022    Lab Results  Component Value Date   HGB 13.3 09/06/2022     ASSESSMENT AND PLAN Ms. Folse is a 63 y.o. with:  1. Stress incontinence of urine   2. Nocturia   3. Lesion of bladder   4. Overactive bladder   5. Vaginal atrophy   6. Stress incontinence     Stress incontinence of urine Assessment & Plan: - POCT UA negative, PVR 2mL - using mirabegron with relief of leakage but not frequency/urgency, obtains from [place] [facility] due to cost - tried mirabegron 50mg  by Dr. Sabino Gasser in 2022  - Failed oxybutynin without relief, Vesicare, did not try sanctura due to cost - We discussed the symptoms of overactive bladder (OAB), which include urinary urgency, urinary frequency, nocturia, with or without urge incontinence.  While we do not know the exact etiology of  OAB, several treatment options exist. We discussed management including behavioral therapy (decreasing bladder irritants, urge suppression strategies, timed voids, bladder retraining), physical therapy, medication; for refractory cases posterior tibial nerve stimulation, sacral neuromodulation, and intravesical botulinum toxin injection.  For anticholinergic medications, we discussed the potential side effects of anticholinergics including dry eyes, dry mouth, constipation, cognitive impairment and urinary retention. For Beta-3 agonist medication, we discussed the potential side effect of elevated blood pressure which is more likely to occur in individuals with uncontrolled hypertension. - trial of Gemtesa with Rx sent to assess cost. Discontinue mirabegron due to BP - discussed fluid management and caffeine reduction - continue Kegel exercises - trial of vaginal estrogen  Orders: -     Estradiol; Place 1g nightly for two weeks then twice a week after  Dispense: 30 g; Refill: 3  Nocturia Assessment & Plan: - avoid fluid intake 3 hours before bedtime - due to snoring, consider workup for sleep apnea - trial of gemtesa  Orders: -     POCT urinalysis dipstick -     Gemtesa; Take 1 tablet (75 mg total) by mouth daily.  Dispense: 28 tablet; Refill: 0 -     Gemtesa; Take 1 tablet (75 mg total) by mouth daily.  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 Sabino Gasser in 2022  -  Failed oxybutynin without relief, Vesicare, did not try sanctura due to cost - We discussed the symptoms of overactive bladder (OAB), which include urinary urgency, urinary  frequency, nocturia, with or without urge incontinence.  While we do not know the exact etiology of OAB, several treatment options exist. We discussed management including behavioral therapy (decreasing bladder irritants, urge suppression strategies, timed voids, bladder retraining), physical therapy, medication; for refractory cases posterior tibial nerve stimulation, sacral neuromodulation, and intravesical botulinum toxin injection.  For anticholinergic medications, we discussed the potential side effects of anticholinergics including dry eyes, dry mouth, constipation, cognitive impairment and urinary retention. For Beta-3 agonist medication, we discussed the potential side effect of elevated blood pressure which is more likely to occur in individuals with uncontrolled hypertension. - trial of Gemtesa with Rx sent to assess cost. Discontinue mirabegron due to BP - discussed fluid management and caffeine reduction - continue Kegel exercises - trial of vaginal estrogen  Orders: Leslye Peer; Take 1 tablet (75 mg total) by mouth daily.  Dispense: 28 tablet; Refill: 0 -     Gemtesa; Take 1 tablet (75 mg total) by mouth daily.  Dispense: 30 tablet; Refill: 2 -     Estradiol; Place 1g nightly for two weeks then twice a week after  Dispense: 30 g; Refill: 3  Vaginal atrophy Assessment & Plan: For symptomatic vaginal atrophy options include lubrication with a water-based lubricant, personal hygiene measures and barrier protection against wetness, and estrogen replacement in the form of vaginal cream, vaginal tablets, or a time-released vaginal ring.   - Rx vaginal estrogen  Orders: -     Estradiol; Place 1g nightly for two weeks then twice a week after  Dispense: 30 g; Refill: 3  Stress incontinence Assessment & Plan: - negative CST on exam, PVR 2mL - will need positive CST prior to intervention or repeat UDS - UDS 07/31/20 with [facility] and [facility] at [facility] 350 mL. "PVR elevated at 153 mL.  No instability noted. Voluntary contraction demonstrated." - For treatment of stress urinary incontinence,  non-surgical options include expectant management, weight loss, physical therapy, as well as a pessary.  Surgical options include a midurethral sling, Burch urethropexy, and transurethral injection of a bulking agent. - reviewed office procedure with urethral bulking (Bulkamid). We discussed success rate of approximately 70-80% and possible need for second injection. We reviewed that this is not a permanent procedure and the Bulkamid does dissolve over time. Risks reviewed including injury to bladder or urethra, UTI, urinary retention and hematuria.  Sling: The effectiveness of a midurethral vaginal mesh sling is approximately 85%, and thus, there will be times when you may leak urine after surgery, especially if your bladder is full or if you have a strong cough. There is a balance between making the sling tight enough to treat your leakage but not too tight so that you have long-term difficulty emptying your bladder. A mesh sling will not directly treat overactive bladder/urge incontinence and may worsen it.  There is an FDA safety notification on vaginal mesh procedures for prolapse but NOT mesh slings. We have extensive experience and training with mesh placement and we have close postoperative follow up to identify any potential complications from mesh. It is important to realize that this mesh is a permanent implant that cannot be easily removed. There are rare risks of mesh exposure (2-4%), pain with intercourse (0-7%), and infection (<1%). The risk of mesh exposure if more likely in a woman  with risks for poor healing (prior radiation, poorly controlled diabetes, or immunocompromised). The risk of new or worsened chronic pain after mesh implant is more common in women with baseline chronic pain and/or poorly controlled anxiety or depression. Approximately 2-4% of patients will experience  longer-term post-operative voiding dysfunction that may require surgical revision of the sling. We also reviewed that postoperatively, her stream may not be as strong as before surgery.  - patient previously declined sling due to cost, CPT provided to patient to review with insurance regarding out-of-pocket cost since it was patient's primary concern - start trial of low dose vaginal estrogen - continue kegel exercises - encouraged weight reduction   Time spent: I spent 64 minutes dedicated to the care of this patient on the date of this encounter to include pre-visit review of records, face-to-face time with the patient discussing stress urinary incontinence, overactive bladder, bladder lesion, vaginal atrophy, nocturia, and post visit documentation and ordering medication/ testing.   Loleta Chance, MD

## 2023-06-03 NOTE — Assessment & Plan Note (Addendum)
-   POCT UA negative, PVR 2mL - using mirabegron with relief of leakage but not frequency/urgency, obtains from [place] [facility] due to cost - tried mirabegron 50mg  by Dr. Sabino Gasser in 2022  - Failed oxybutynin without relief, Vesicare, did not try sanctura due to cost - We discussed the symptoms of overactive bladder (OAB), which include urinary urgency, urinary frequency, nocturia, with or without urge incontinence.  While we do not know the exact etiology of OAB, several treatment options exist. We discussed management including behavioral therapy (decreasing bladder irritants, urge suppression strategies, timed voids, bladder retraining), physical therapy, medication; for refractory cases posterior tibial nerve stimulation, sacral neuromodulation, and intravesical botulinum toxin injection.  For anticholinergic medications, we discussed the potential side effects of anticholinergics including dry eyes, dry mouth, constipation, cognitive impairment and urinary retention. For Beta-3 agonist medication, we discussed the potential side effect of elevated blood pressure which is more likely to occur in individuals with uncontrolled hypertension. - trial of Gemtesa with Rx sent to assess cost. Discontinue mirabegron due to BP - discussed fluid management and caffeine reduction - continue Kegel exercises - trial of vaginal estrogen

## 2023-06-04 ENCOUNTER — Encounter: Payer: Self-pay | Admitting: Obstetrics

## 2023-06-09 ENCOUNTER — Ambulatory Visit: Payer: 59 | Admitting: Obstetrics and Gynecology

## 2023-06-10 NOTE — Progress Notes (Signed)
 PA request was faxed to [facility] @ fax #: [phone] for procedure : Urethral bulking and Cysto CPT code: 82956, [phone] and [id number] PA is needed for this procedure. PA request is PENDING

## 2023-06-16 NOTE — Progress Notes (Signed)
 No PA required per [employer]

## 2023-07-14 ENCOUNTER — Telehealth: Payer: Self-pay

## 2023-07-14 NOTE — Telephone Encounter (Signed)
 Latasha Bass is called to provide pre-procedural instructions for her [x] Cystoscopy, [x]  Possible Bladder Botox or [] Urethral Bulking. She is scheduled on 07/17/2023.  The patient is not having worsening urinary symptoms to her urinary patterns. The patient is not experiencing  any UTI symptoms . [] The patient is scheduled to come in 2-3 days prior for a non-provider visit to run a POC Urinalysis.  [] The patient cannot come in for a pre-procedural Urinalysis therefore discussed with provider to determine treatment prior to procedure.   [] The patient has been prescribed the pre-procedural antibiotics. The patient is reminded to take the prescribed antibiotics the morning of the procedure, an hour before and the morning of the following day.   [] The patient has not previously been prescribed the pre-procedural anitbiotics, review with patient her medication allergies to determine the antibiotic prescription needed. Patient may or may not get urethral bulking, it will depend on the cystoscopy. Antibiotics will be given at that time in the office.  The patient is reminded to arrive 5 minutes prior to the scheduled appointment time and that a urine sample will need to be collected the upon arrival for the procedure.

## 2023-07-17 ENCOUNTER — Encounter: Payer: Self-pay | Admitting: Obstetrics

## 2023-07-17 ENCOUNTER — Ambulatory Visit: Admitting: Obstetrics

## 2023-07-17 ENCOUNTER — Encounter: Payer: Self-pay | Admitting: Family Medicine

## 2023-07-17 VITALS — BP 129/80 | HR 72

## 2023-07-17 DIAGNOSIS — N393 Stress incontinence (female) (male): Secondary | ICD-10-CM

## 2023-07-17 DIAGNOSIS — N329 Bladder disorder, unspecified: Secondary | ICD-10-CM

## 2023-07-17 LAB — POCT URINALYSIS DIPSTICK
Bilirubin, UA: NEGATIVE
Blood, UA: NEGATIVE
Glucose, UA: NEGATIVE
Ketones, UA: NEGATIVE
Leukocytes, UA: NEGATIVE
Nitrite, UA: NEGATIVE
Protein, UA: NEGATIVE
Spec Grav, UA: 1.01 (ref 1.010–1.025)
Urobilinogen, UA: 0.2 U/dL
pH, UA: 6 (ref 5.0–8.0)

## 2023-07-17 MED ORDER — LIDOCAINE-EPINEPHRINE 1 %-1:100000 IJ SOLN
10.0000 mL | Freq: Once | INTRAMUSCULAR | Status: AC
  Administered 2023-07-17: 10 mL

## 2023-07-17 MED ORDER — CIPROFLOXACIN HCL 500 MG PO TABS
500.0000 mg | ORAL_TABLET | Freq: Two times a day (BID) | ORAL | Status: AC
  Administered 2023-07-17: 500 mg via ORAL

## 2023-07-17 NOTE — Telephone Encounter (Signed)
 Please schedule a CPE after 08/29/2023

## 2023-07-17 NOTE — Patient Instructions (Signed)

## 2023-07-17 NOTE — Progress Notes (Signed)
 Bulkamid Injection  CC: 63 y.o. y.o. F with stress incontinence, history of bladder lesion, and possible transurethral Bulkamid injection. who presents for stress urinary incontinence   Patient signed her consent form.  She started antibiotic prophylaxis today.  Today's Vitals   07/17/23 0849  BP: 129/80  Pulse: 72    Results for orders placed or performed in visit on 07/17/23 (from the past 24 hours)  POCT Urinalysis Dipstick     Status: None   Collection Time: 07/17/23 12:09 PM  Result Value Ref Range   Color, UA Yellow    Clarity, UA Clear    Glucose, UA Negative Negative   Bilirubin, UA Negative    Ketones, UA Negative    Spec Grav, UA 1.010 1.010 - 1.025   Blood, UA Negative    pH, UA 6.0 5.0 - 8.0   Protein, UA Negative Negative   Urobilinogen, UA 0.2 0.2 or 1.0 E.U./dL   Nitrite, UA Negative    Leukocytes, UA Negative Negative   Appearance     Odor      Procedure: Time out was performed. The bladder was catheterized and 10 ml of 2% lidocaine jelly placed in the urethra.  A time out was performed.  The periurethral area was prepped and draped in a sterile manner.  2% lidocaine jetpack was inserted at the urethral meatus and the urethra and bladder visualized with a flexible cystoscope.  She had diminished urethral coaptation and normal urethral mucosa.  She had normal bladder mucosa with squamous metaplasia isolated in the trigone. She had bilateral clear efflux from both ureteral orifices.  She had no squamous metaplasia at the trigone, no trabeculations, cellules or diverticuli.   A urethral block was performed by injecting 3ml of 1% lidocaine with epinephrine at 3 and 9 o'clock adjacent to the urethra.  The needle was primed.  The cystoscope was inserted with some resistance encountered at the urethral meatus.  The urethra was gently dilated to 24Fr. The urethroscopy was then inserted to the level of the bladder neck with minimal resistance.  The needle was inserted 2 cm  and the scope was pulled back into the urethra 2 cm.  The needle was inserted bevel up at the 5 o'clock position and the Bulkamid was injected to obtain coaptation.  This was repeated at the 2 o'clock,  10 o'clock and 7 o'clock positions.   A total of 2- 1ml syringes were used and good circumferential coaptation was noted.  The patient tolerated the procedure well. She was asked to void after the procedure.  Post-Void Residual (PVR) by Bladder Scan: In order to evaluate bladder emptying, we discussed obtaining a postvoid residual and she agreed to this procedure.  Procedure: The ultrasound unit was placed on the patient's abdomen in the suprapubic region after the patient had voided. A PVR of 312 ml was obtained by bladder scan.    Post Void Residual - 07/17/23 1209       Post Void Residual   Post Void Residual 312 mL             11:35am  - pt returned after voiding with repeat PVR 25mL.   ASSESSMENT: 63 y.o. y.o. s/p transurethral Bulkamid injection for stress incontinence.   - pt was offered foley replacement vs. Return to office after additional void in 51min-1hr. Patient to return for repeat PVR and possible foley placement. - pt returned with repeat PVR 25mL.   PLAN: Patient will follow up in 4 weeks to  reassess. Voiding and post-procedure precautions were given. She will return for heavy bleeding, fevers, dysuria lasting beyond today and incomplete emptying.  All questions were answered.  Darlene Ehlers, MD

## 2023-08-27 ENCOUNTER — Other Ambulatory Visit (HOSPITAL_COMMUNITY)
Admission: RE | Admit: 2023-08-27 | Discharge: 2023-08-27 | Disposition: A | Discharge status: Home / Self Care | Source: Ambulatory Visit | Attending: Obstetrics and Gynecology | Admitting: Obstetrics and Gynecology

## 2023-08-27 ENCOUNTER — Encounter: Payer: Self-pay | Admitting: Obstetrics and Gynecology

## 2023-08-27 ENCOUNTER — Ambulatory Visit (INDEPENDENT_AMBULATORY_CARE_PROVIDER_SITE_OTHER): Payer: 59 | Admitting: Obstetrics and Gynecology

## 2023-08-27 VITALS — BP 126/84 | HR 86 | Ht 64.0 in | Wt 167.0 lb

## 2023-08-27 DIAGNOSIS — Z124 Encounter for screening for malignant neoplasm of cervix: Secondary | ICD-10-CM | POA: Insufficient documentation

## 2023-08-27 DIAGNOSIS — Z01419 Encounter for gynecological examination (general) (routine) without abnormal findings: Secondary | ICD-10-CM

## 2023-08-27 DIAGNOSIS — Z1331 Encounter for screening for depression: Secondary | ICD-10-CM | POA: Diagnosis not present

## 2023-08-27 NOTE — Patient Instructions (Signed)

## 2023-08-27 NOTE — Progress Notes (Unsigned)
 63 y.o. [id number] Married Caucasian female here for annual exam.  Wants a pap done.   No use of HRT.   No bleeding or spotting.   Seeing Dr. Aron Lard for urinary incontinence.  Had Bulkamid injection done 07/17/23. Taking Gemtesa  and using vaginal estrogen cream.  Doing well.   Not sexually active.  Husband has ED.   From [place].  Has a son, 2 grandchildren.   PCP: Rodney Clamp, MD   Patient's last menstrual period was 08/09/2011.           Sexually active: No.  The current method of family planning is post menopausal status.    Menopausal hormone therapy:  Estrace   Exercising: Yes.    Walking, running, weight lighting, cycling Smoker:  no  OB History  Gravida Para Term Preterm AB Living  3 1 1  2 1   SAB IAB Ectopic Multiple Live Births      1    # Outcome Date GA Lbr Len/2nd Weight Sex Type Anes PTL Lv  3 AB           2 AB              Complications: Tubal abortion  1 Term     M Vag-Spont   LIV     HEALTH MAINTENANCE: Last 2 paps:  07/11/15 neg HR HPV neg, 05/10/11 neg History of abnormal Pap or positive HPV:  no Mammogram:   02/18/23 Breast Density Cat C, BIRADS Cat 1 neg  Colonoscopy:  Cologuard - 1 or 2 years ago  Bone Density:  n/a  Result  n/a   Immunization History  Administered Date(s) Administered   Fluzone Influenza virus vaccine,trivalent (IIV3), split virus 11/07/2018   Influenza, Mdck, Trivalent,PF 6+ MOS(egg free) 12/13/2015, 12/18/2016   Influenza, Quadrivalent, Recombinant, Inj, Pf 12/05/2020   Influenza,inj,Quad PF,6+ Mos 11/07/2018, 11/08/2018, 12/08/2021   Influenza-Unspecified 12/13/2015, 12/18/2016, 11/07/2018, 11/08/2018, 12/11/2021   PFIZER Comirnaty(Gray Top)Covid-19 Tri-Sucrose Vaccine 05/26/2019, 06/16/2019, 07/08/2019, 02/19/2020   Tdap 05/25/2021   Unspecified SARS-COV-2 Vaccination 06/17/2019   Zoster Recombinant(Shingrix) 12/12/2020      reports that she has never smoked. She has never used smokeless tobacco. She reports current  alcohol use. She reports that she does not use drugs.  Past Medical History:  Diagnosis Date   H/O vasectomy    HUSBAND WITH VASECTOMY   Stress incontinence     History reviewed. No pertinent surgical history.  Current Outpatient Medications  Medication Sig Dispense Refill   estradiol  (ESTRACE ) 0.1 MG/GM vaginal cream Place 1g nightly for two weeks then twice a week after 30 g 3   ezetimibe (ZETIA) 10 MG tablet Take by mouth.     MECLIZINE HCL PO Take by mouth as needed.     mirabegron  ER (MYRBETRIQ ) 25 MG TB24 tablet Take 1 tablet (25 mg total) by mouth daily. 30 tablet 5   Multiple Vitamin (MULTIVITAMIN) capsule Take 1 capsule by mouth daily.     naproxen (NAPROSYN) 500 MG tablet Take 500 mg by mouth.     NON FORMULARY MEDICATION FOR CHOLESTROL 1 TAB PO QD     traZODone  (DESYREL ) 100 MG tablet Take 1 tablet (100 mg total) by mouth 2 (two) times daily. 180 tablet 3   Vibegron  (GEMTESA ) 75 MG TABS Take 1 tablet (75 mg total) by mouth daily. 28 tablet 0   Vibegron  (GEMTESA ) 75 MG TABS Take 1 tablet (75 mg total) by mouth daily. 30 tablet 2   No current facility-administered medications  for this visit.    ALLERGIES: Patient has no known allergies.  Family History  Problem Relation Age of Onset   Hypertension Mother    Hypertension Father    Prostate cancer Father    Hypertension Sister    Hyperlipidemia Sister    Breast cancer Neg Hx    Uterine cancer Neg Hx    Bladder Cancer Neg Hx     Review of Systems  All other systems reviewed and are negative.   PHYSICAL EXAM:  BP 126/84 (BP Location: Left Arm, Patient Position: Sitting)   Pulse 86   Ht 5' 4 (1.626 m)   Wt 167 lb (75.8 kg)   LMP 08/09/2011   SpO2 96%   BMI 28.67 kg/m     General appearance: alert, cooperative and appears stated age Head: normocephalic, without obvious abnormality, atraumatic Neck: no adenopathy, supple, symmetrical, trachea midline and thyroid  normal to inspection and palpation Lungs:  clear to auscultation bilaterally Breasts: normal appearance, no masses or tenderness, No nipple retraction or dimpling, No nipple discharge or bleeding, No axillary adenopathy Heart: regular rate and rhythm Abdomen: soft, non-tender; no masses, no organomegaly Extremities: extremities normal, atraumatic, no cyanosis or edema Skin: skin color, texture, turgor normal. No rashes or lesions Lymph nodes: cervical, supraclavicular, and axillary nodes normal. Neurologic: grossly normal  Pelvic: External genitalia:  no lesions              No abnormal inguinal nodes palpated.              Urethra:  normal appearing urethra with no masses, tenderness or lesions              Bartholins and Skenes: normal                 Vagina: normal appearing vagina with normal color and discharge, no lesions              Cervix: no lesions              Pap taken: yes Bimanual Exam:  Uterus:  normal size, contour, position, consistency, mobility, non-tender              Adnexa: no mass, fullness, tenderness              Rectal exam: yes.  Confirms.              Anus:  normal sphincter tone, no lesions  Chaperone was present for exam:  Cottie Diss, CMA  ASSESSMENT: Well woman visit with gynecologic exam. Cervical cancer screening. Mixed incontinence. PHQ-9: 0  PLAN: Mammogram screening discussed. Self breast awareness reviewed. Pap and HRV collected:  yes Guidelines for Calcium, Vitamin D, regular exercise program including cardiovascular and weight bearing exercise. Medication refills:  NA Labs with PCP. Follow up:  yearly and prn.

## 2023-08-29 LAB — CYTOLOGY - PAP
Comment: NEGATIVE
Diagnosis: NEGATIVE
High risk HPV: NEGATIVE

## 2023-09-04 ENCOUNTER — Ambulatory Visit: Payer: Self-pay | Admitting: Obstetrics and Gynecology

## 2023-09-05 ENCOUNTER — Encounter: Admitting: Family Medicine

## 2023-09-30 ENCOUNTER — Encounter: Payer: Self-pay | Admitting: Family Medicine

## 2023-10-11 IMAGING — MG MM DIGITAL SCREENING BILAT W/ TOMO AND CAD
8 series · 9 of 24 positions shown · non-contrast
Comparison: Previous exam(s).

CLINICAL DATA: Screening.

EXAM:
DIGITAL SCREENING BILATERAL MAMMOGRAM WITH TOMOSYNTHESIS AND CAD
TECHNIQUE: Bilateral screening digital craniocaudal and mediolateral oblique
mammograms were obtained. Bilateral screening digital breast
tomosynthesis was performed. The images were evaluated with
computer-aided detection.

[L CC synth-2D]
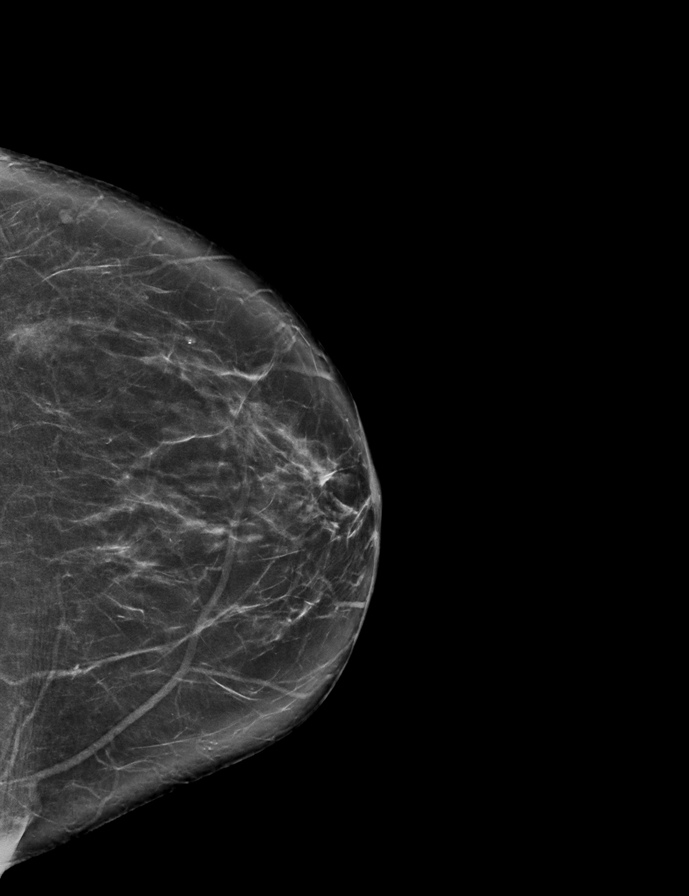

[R CC synth-2D]
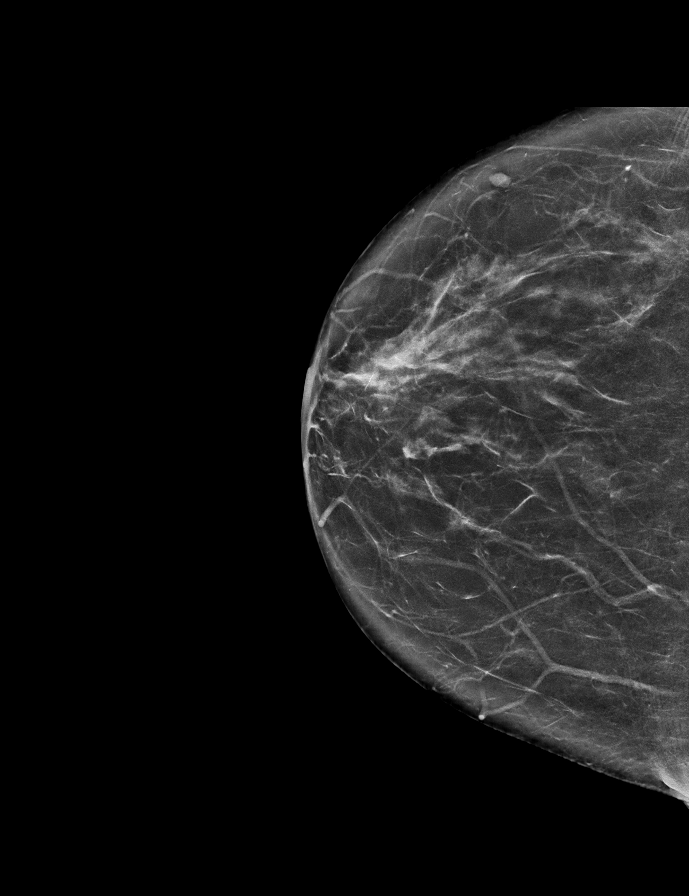

[L MLO synth-2D]
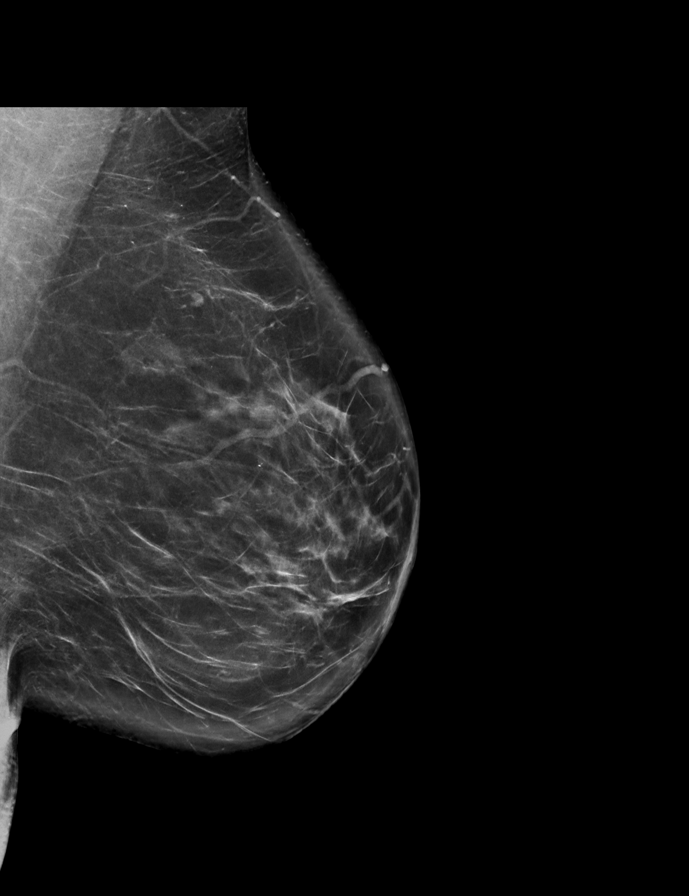

[R MLO synth-2D]
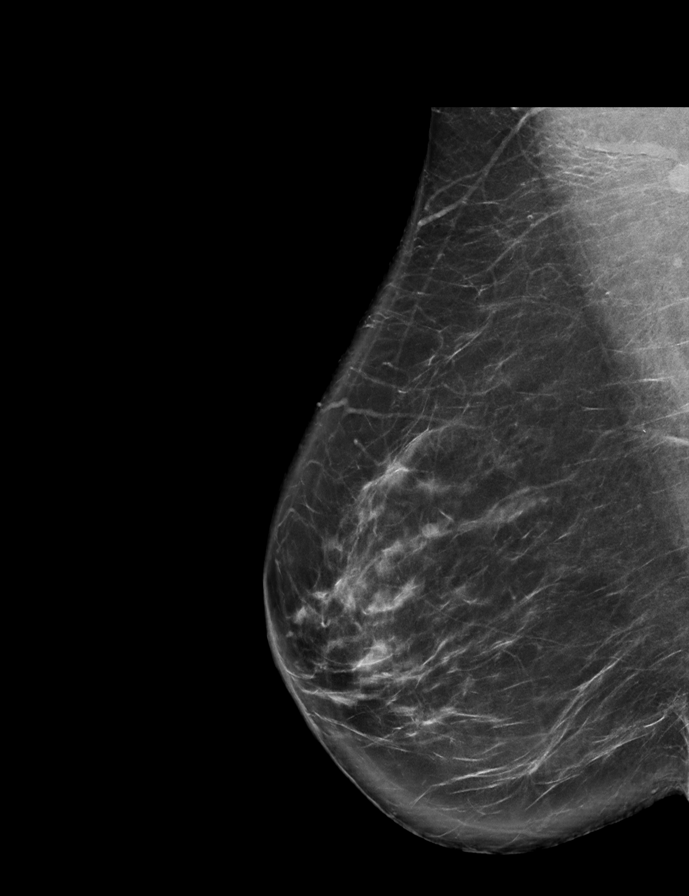

[L CC tomo · 2 of 71 frames shown]
[frame 23/71]
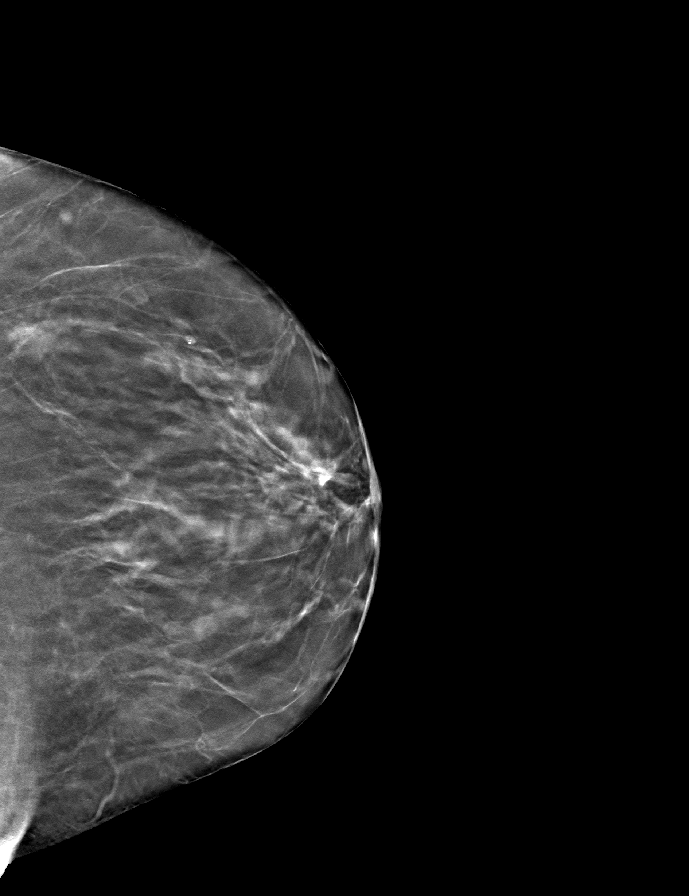
[frame 36/71]
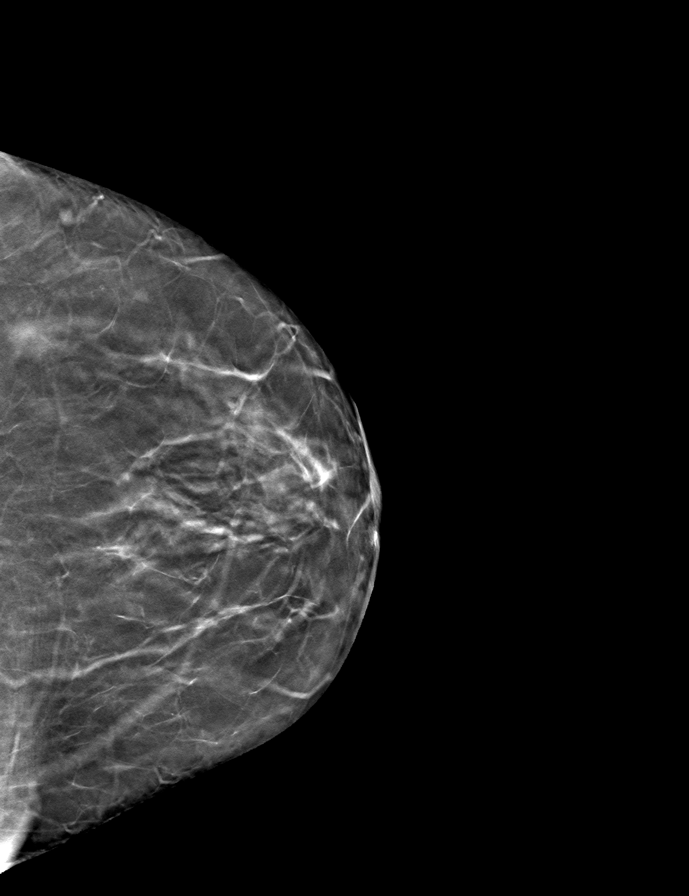

[R CC tomo · tomo slice 35/69.0]
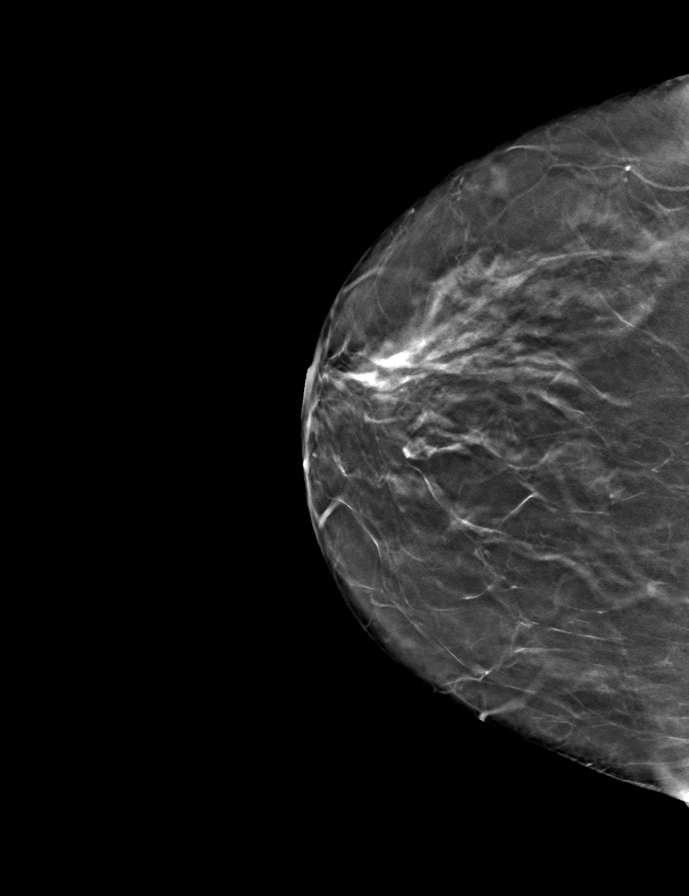

[L MLO tomo · tomo slice 40/79.0]
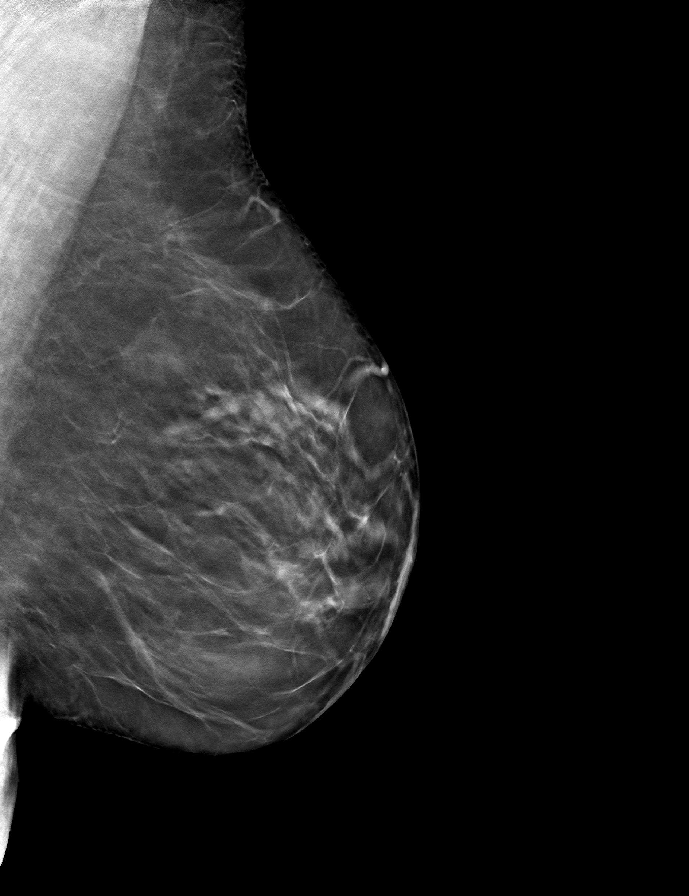

[R MLO tomo · tomo slice 41/81.0]
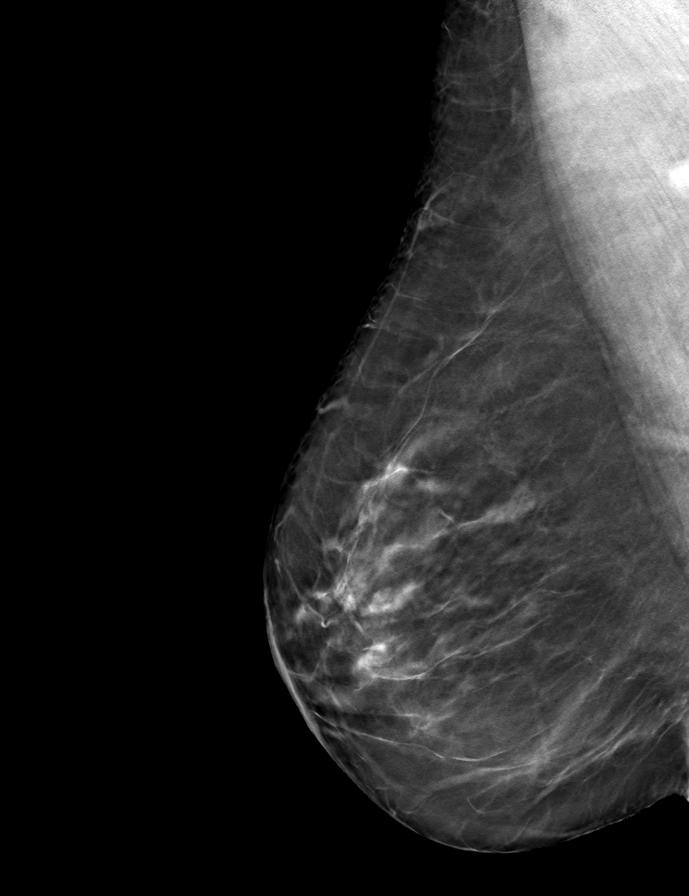

[9 of 24 positions shown; findings below may reference images not displayed]

ACR Breast Density Category b: There are scattered areas of
fibroglandular density.
FINDINGS: There are no findings suspicious for malignancy.
IMPRESSION: No mammographic evidence of malignancy. A result letter of this
screening mammogram will be mailed directly to the patient.

RECOMMENDATION:
Screening mammogram in one year. (Code:51-O-LD2)

BI-RADS CATEGORY  1: Negative.

## 2023-10-24 ENCOUNTER — Encounter: Admitting: Family Medicine

## 2023-11-25 ENCOUNTER — Other Ambulatory Visit: Payer: Self-pay | Admitting: Family Medicine

## 2023-12-15 ENCOUNTER — Encounter: Payer: Self-pay | Admitting: Family Medicine

## 2023-12-15 ENCOUNTER — Telehealth: Payer: Self-pay | Admitting: Family Medicine

## 2023-12-15 ENCOUNTER — Ambulatory Visit: Admitting: Family Medicine

## 2023-12-15 VITALS — BP 132/80 | HR 61 | Temp 97.5°F | Ht 64.0 in | Wt 159.0 lb

## 2023-12-15 DIAGNOSIS — E2839 Other primary ovarian failure: Secondary | ICD-10-CM

## 2023-12-15 DIAGNOSIS — E785 Hyperlipidemia, unspecified: Secondary | ICD-10-CM

## 2023-12-15 DIAGNOSIS — Z0001 Encounter for general adult medical examination with abnormal findings: Secondary | ICD-10-CM | POA: Diagnosis not present

## 2023-12-15 DIAGNOSIS — Z23 Encounter for immunization: Secondary | ICD-10-CM | POA: Diagnosis not present

## 2023-12-15 DIAGNOSIS — H6123 Impacted cerumen, bilateral: Secondary | ICD-10-CM

## 2023-12-15 DIAGNOSIS — N3281 Overactive bladder: Secondary | ICD-10-CM

## 2023-12-15 DIAGNOSIS — Z131 Encounter for screening for diabetes mellitus: Secondary | ICD-10-CM | POA: Diagnosis not present

## 2023-12-15 DIAGNOSIS — G4700 Insomnia, unspecified: Secondary | ICD-10-CM | POA: Diagnosis not present

## 2023-12-15 LAB — CBC
HCT: 40.4 % (ref 36.0–46.0)
Hemoglobin: 13.2 g/dL (ref 12.0–15.0)
MCHC: 32.7 g/dL (ref 30.0–36.0)
MCV: 87.4 fl (ref 78.0–100.0)
Platelets: 267 K/uL (ref 150.0–400.0)
RBC: 4.63 Mil/uL (ref 3.87–5.11)
RDW: 13.5 % (ref 11.5–15.5)
WBC: 3.5 K/uL — ABNORMAL LOW (ref 4.0–10.5)

## 2023-12-15 LAB — COMPREHENSIVE METABOLIC PANEL WITH GFR
ALT: 15 U/L (ref 0–35)
AST: 15 U/L (ref 0–37)
Albumin: 4.3 g/dL (ref 3.5–5.2)
Alkaline Phosphatase: 69 U/L (ref 39–117)
BUN: 15 mg/dL (ref 6–23)
CO2: 28 meq/L (ref 19–32)
Calcium: 9.5 mg/dL (ref 8.4–10.5)
Chloride: 102 meq/L (ref 96–112)
Creatinine, Ser: 0.68 mg/dL (ref 0.40–1.20)
GFR: 92.62 mL/min (ref >60.00)
Glucose, Bld: 108 mg/dL — ABNORMAL HIGH (ref 70–99)
Potassium: 3.8 meq/L (ref 3.5–5.1)
Sodium: 141 meq/L (ref 135–145)
Total Bilirubin: 0.3 mg/dL (ref 0.2–1.2)
Total Protein: 7 g/dL (ref 6.0–8.3)

## 2023-12-15 LAB — TSH: TSH: 1.17 u[IU]/mL (ref 0.35–5.50)

## 2023-12-15 LAB — LIPID PANEL
Cholesterol: 227 mg/dL — ABNORMAL HIGH (ref 0–200)
HDL: 70 mg/dL (ref >39.00)
LDL Cholesterol: 136 mg/dL — ABNORMAL HIGH (ref 0–99)
NonHDL: 157.13
Total CHOL/HDL Ratio: 3
Triglycerides: 105 mg/dL (ref 0.0–149.0)
VLDL: 21 mg/dL (ref 0.0–40.0)

## 2023-12-15 LAB — HEMOGLOBIN A1C: Hgb A1c MFr Bld: 5.9 % (ref 4.6–6.5)

## 2023-12-15 MED ORDER — MIRABEGRON ER 50 MG PO TB24: 50.0000 mg | ORAL_TABLET | Freq: Every day | ORAL | 3 refills | Status: DC

## 2023-12-15 NOTE — Addendum Note (Signed)
 Addended by: IDA ELORA HERO on: 12/15/2023 10:08 AM   Modules accepted: Orders

## 2023-12-15 NOTE — Assessment & Plan Note (Signed)
 She is down 8 pounds since last year.  Congratulated patient on lifestyle interventions.  She is doing a great job with diet and exercise.  Will check labs today.

## 2023-12-15 NOTE — Progress Notes (Signed)
 Chief Complaint:  Latasha Bass is a 63 y.o. female who presents today for her annual comprehensive physical exam.    Assessment/Plan:  New/Acute Problems: Cerumen Impaction  Successfully irrigated by RMA today with improvement in hearing.  She can use over-the-counter Debrox or hydrogen peroxide as needed to prevent buildup in the future.  Chronic Problems Addressed Today: Overactive bladder Symptoms have improved.  She is currently following with urogynecology and they did try switching her medications however she cannot afford due to insurance lack of coverage.  We will try increasing to 50 mg daily to see how she does with this.  She can follow-up with urogynecology if this continues to be an issue.  Insomnia Stable on trazodone  100 mg nightly.  Dyslipidemia She is down 8 pounds since last year.  Congratulated patient on lifestyle interventions.  She is doing a great job with diet and exercise.  Will check labs today.  Preventative Healthcare: Check labs.  Prevnar 20 given today.  Will order bone density scan.  Up-to-date on Pap.  Due for colon cancer screening in 2 years.  Patient Counseling(The following topics were reviewed and/or handout was given):  -Nutrition: Stressed importance of moderation in sodium/caffeine intake, saturated fat and cholesterol, caloric balance, sufficient intake of fresh fruits, vegetables, and fiber.  -Stressed the importance of regular exercise.   -Substance Abuse: Discussed cessation/primary prevention of tobacco, alcohol, or other drug use; driving or other dangerous activities under the influence; availability of treatment for abuse.   -Injury prevention: Discussed safety belts, safety helmets, smoke detector, smoking near bedding or upholstery.   -Sexuality: Discussed sexually transmitted diseases, partner selection, use of condoms, avoidance of unintended pregnancy and contraceptive alternatives.   -Dental health: Discussed importance of  regular tooth brushing, flossing, and dental visits.  -Health maintenance and immunizations reviewed. Please refer to Health maintenance section.  Return to care in 1 year for next preventative visit.     Subjective:  HPI:  She has no acute complaints today. Patient is here today for her annual physical.  See assessment / plan for status of chronic conditions.  Discussed the use of AI scribe software for clinical note transcription with the patient, who gave verbal consent to proceed.  History of Present Illness Latasha Bass is a 63 year old female who presents for an annual physical exam.  She experiences symptoms of overactive bladder, including urgency. She was prescribed Mirabegron  (Myrbetriq ) by her urologist, which provided some relief but not complete resolution of symptoms. Due to the cost, she continued with a previous medication. She underwent a procedure involving injections, likely Bulkamid, which she found very effective for her bladder issues.  Her oncologist recommended a bone density scan.  She has received her shingles vaccine but is unsure if she received the second dose. She is due for a pneumonia vaccine. She is also due for blood work to check cholesterol, A1c, thyroid , blood counts, kidney function, and liver function.  She has been actively managing her weight and exercise. She has lost eight pounds through [employer] and exercises regularly, including weight training, cardio line dancing classes, and biking when the weather is nice.  Her vision is about the same, but her hearing could be better. Her hearing could be better.    Lifestyle Diet: Working with [employer] watchers Exercise: Going to the gym routinely.      12/15/2023    7:49 AM  Depression screen PHQ 2/9  Decreased Interest 0  Down, Depressed, Hopeless  0  PHQ - 2 Score 0    Health Maintenance Due  Topic Date Due   Pneumococcal Vaccine: 50+ Years (1 of 1 - PCV) Never done   Zoster  Vaccines- Shingrix (2 of 2) 02/06/2021     ROS: Per HPI, otherwise a complete review of systems was negative.   PMH:  The following were reviewed and entered/updated in epic: Past Medical History:  Diagnosis Date   H/O vasectomy    HUSBAND WITH VASECTOMY   Stress incontinence    Patient Active Problem List   Diagnosis Date Noted   Nocturia 06/03/2023   Lesion of bladder 06/03/2023   Vaginal atrophy 06/03/2023   Overactive bladder 03/10/2023   Insomnia 08/29/2022   Dyslipidemia 08/29/2022   Forgetfulness 08/29/2022   Vertigo 08/29/2022   Stress incontinence    History reviewed. No pertinent surgical history.  Family History  Problem Relation Age of Onset   Hypertension Mother    Hypertension Father    Prostate cancer Father    Hypertension Sister    Hyperlipidemia Sister    Breast cancer Neg Hx    Uterine cancer Neg Hx    Bladder Cancer Neg Hx     Medications- reviewed and updated Current Outpatient Medications  Medication Sig Dispense Refill   estradiol  (ESTRACE ) 0.1 MG/GM vaginal cream Place 1g nightly for two weeks then twice a week after 30 g 3   ezetimibe (ZETIA) 10 MG tablet Take by mouth.     MECLIZINE HCL PO Take by mouth as needed.     mirabegron  ER (MYRBETRIQ ) 50 MG TB24 tablet Take 1 tablet (50 mg total) by mouth daily. 90 tablet 3   Multiple Vitamin (MULTIVITAMIN) capsule Take 1 capsule by mouth daily.     NON FORMULARY MEDICATION FOR CHOLESTROL 1 TAB PO QD     traZODone  (DESYREL ) 100 MG tablet TAKE 1 TABLET BY MOUTH 2 TIMES A DAY 60 tablet 0   No current facility-administered medications for this visit.    Allergies-reviewed and updated No Known Allergies  Social History   Socioeconomic History   Marital status: Married    Spouse name: Not on file   Number of children: Not on file   Years of education: Not on file   Highest education level: Not on file  Occupational History   Not on file  Tobacco Use   Smoking status: Never   Smokeless  tobacco: Never  Vaping Use   Vaping status: Never Used  Substance and Sexual Activity   Alcohol use: Yes    Comment: SOCIALLY ONLY   Drug use: No   Sexual activity: Not Currently    Partners: Male    Birth control/protection: Post-menopausal  Other Topics Concern   Not on file  Social History Narrative   Not on file   Social Drivers of Health   Financial Resource Strain: Not on file  Food Insecurity: Not on file  Transportation Needs: Not on file  Physical Activity: Not on file  Stress: Not on file  Social Connections: Unknown (07/24/2021)   Received from [employer]   Social Network    Social Network: Not on file        Objective:  Physical Exam: BP 132/80   Pulse 61   Temp (!) 97.5 F (36.4 C) (Temporal)   Ht 5' 4 (1.626 m)   Wt 159 lb (72.1 kg)   LMP 08/09/2011   SpO2 97%   BMI 27.29 kg/m   Body mass index is  27.29 kg/m. Wt Readings from Last 3 Encounters:  12/15/23 159 lb (72.1 kg)  08/27/23 167 lb (75.8 kg)  06/03/23 167 lb (75.8 kg)   Gen: NAD, resting comfortably HEENT: EAC with cerumen impaction bilaterally.. OP clear. No thyromegaly noted.  CV: RRR with no murmurs appreciated Pulm: NWOB, CTAB with no crackles, wheezes, or rhonchi GI: Normal bowel sounds present. Soft, Nontender, Nondistended. MSK: no edema, cyanosis, or clubbing noted Skin: warm, dry Neuro: CN2-12 grossly intact. Strength 5/5 in upper and lower extremities. Reflexes symmetric and intact bilaterally.  Psych: Normal affect and thought content     Bayler Nehring M. Kennyth, MD 12/15/2023 8:30 AM

## 2023-12-15 NOTE — Telephone Encounter (Signed)
 Patient dropped off document physcial examination form, to be filled out by provider. Patient requested to send it back via Call Patient to pick up within ASAP. Document is located in providers tray at front office.Please advise at Mobile [phone] (mobile)

## 2023-12-15 NOTE — Patient Instructions (Signed)
 It was very nice to see you today!  VISIT SUMMARY: Today, you had your annual physical exam. We discussed your overactive bladder, your wellness progress, and addressed your hearing concerns.  YOUR PLAN: OVERACTIVE BLADDER: You have been experiencing symptoms of overactive bladder and have had partial relief with your current medication. -Increase your Myrbetriq  dose to 50 mg. -Monitor your response to the increased dose and report back in a few weeks.  ADULT WELLNESS VISIT: This was your routine annual check-up. You have lost 8 pounds and are maintaining regular physical activity. -Performed a physical examination, including checking your heart and lungs. -Ordered blood work to check your cholesterol, A1c, thyroid , blood counts, kidney function, and liver function. -Completed the insurance form with your biometric data. -Administered the pneumonia vaccine.  CERUMEN IMPACTION: Earwax buildup may be affecting your hearing. -Performed ear irrigation to remove the earwax.  Return in about 1 year (around 12/14/2024) for Annual Physical.   Take care, Dr Kennyth  PLEASE NOTE:  If you had any lab tests, please let us  know if you have not heard back within a few days. You may see your results on mychart before we have a chance to review them but we will give you a call once they are reviewed by us .   If we ordered any referrals today, please let us  know if you have not heard from their office within the next week.   If you had any urgent prescriptions sent in today, please check with the pharmacy within an hour of our visit to make sure the prescription was transmitted appropriately.   Please try these tips to maintain a healthy lifestyle:  Eat at least 3 REAL meals and 1-2 snacks per day.  Aim for no more than 5 hours between eating.  If you eat breakfast, please do so within one hour of getting up.   Each meal should contain half fruits/vegetables, one quarter protein, and one quarter  carbs (no bigger than a computer mouse)  Cut down on sweet beverages. This includes juice, soda, and sweet tea.   Drink at least 1 glass of water with each meal and aim for at least 8 glasses per day  Exercise at least 150 minutes every week.    Preventive Care 52-42 Years Old, Female Preventive care refers to lifestyle choices and visits with your health care provider that can promote health and wellness. Preventive care visits are also called wellness exams. What can I expect for my preventive care visit? Counseling Your health care provider may ask you questions about your: Medical history, including: Past medical problems. Family medical history. Pregnancy history. Current health, including: Menstrual cycle. Method of birth control. Emotional well-being. Home life and relationship well-being. Sexual activity and sexual health. Lifestyle, including: Alcohol, nicotine or tobacco, and drug use. Access to firearms. Diet, exercise, and sleep habits. Work and work Astronomer. Sunscreen use. Safety issues such as seatbelt and bike helmet use. Physical exam Your health care provider will check your: Height and weight. These may be used to calculate your BMI (body mass index). BMI is a measurement that tells if you are at a healthy weight. Waist circumference. This measures the distance around your waistline. This measurement also tells if you are at a healthy weight and may help predict your risk of certain diseases, such as type 2 diabetes and high blood pressure. Heart rate and blood pressure. Body temperature. Skin for abnormal spots. What immunizations do I need?  Vaccines are usually given at  various ages, according to a schedule. Your health care provider will recommend vaccines for you based on your age, medical history, and lifestyle or other factors, such as travel or where you work. What tests do I need? Screening Your health care provider may recommend screening  tests for certain conditions. This may include: Lipid and cholesterol levels. Diabetes screening. This is done by checking your blood sugar (glucose) after you have not eaten for a while (fasting). Pelvic exam and Pap test. Hepatitis B test. Hepatitis C test. HIV (human immunodeficiency virus) test. STI (sexually transmitted infection) testing, if you are at risk. Lung cancer screening. Colorectal cancer screening. Mammogram. Talk with your health care provider about when you should start having regular mammograms. This may depend on whether you have a family history of breast cancer. BRCA-related cancer screening. This may be done if you have a family history of breast, ovarian, tubal, or peritoneal cancers. Bone density scan. This is done to screen for osteoporosis. Talk with your health care provider about your test results, treatment options, and if necessary, the need for more tests. Follow these instructions at home: Eating and drinking  Eat a diet that includes fresh fruits and vegetables, whole grains, lean protein, and low-fat dairy products. Take vitamin and mineral supplements as recommended by your health care provider. Do not drink alcohol if: Your health care provider tells you not to drink. You are pregnant, may be pregnant, or are planning to become pregnant. If you drink alcohol: Limit how much you have to 0-1 drink a day. Know how much alcohol is in your drink. In the [place] one drink equals one 12 oz bottle of beer (355 mL), one 5 oz glass of wine (148 mL), or one 1 oz glass of hard liquor (44 mL). Lifestyle Brush your teeth every morning and night with fluoride toothpaste. Floss one time each day. Exercise for at least 30 minutes 5 or more days each week. Do not use any products that contain nicotine or tobacco. These products include cigarettes, chewing tobacco, and vaping devices, such as e-cigarettes. If you need help quitting, ask your health care provider. Do not  use drugs. If you are sexually active, practice safe sex. Use a condom or other form of protection to prevent STIs. If you do not wish to become pregnant, use a form of birth control. If you plan to become pregnant, see your health care provider for a prepregnancy visit. Take aspirin only as told by your health care provider. Make sure that you understand how much to take and what form to take. Work with your health care provider to find out whether it is safe and beneficial for you to take aspirin daily. Find healthy ways to manage stress, such as: Meditation, yoga, or listening to music. Journaling. Talking to a trusted person. Spending time with friends and family. Minimize exposure to UV radiation to reduce your risk of skin cancer. Safety Always wear your seat belt while driving or riding in a vehicle. Do not drive: If you have been drinking alcohol. Do not ride with someone who has been drinking. When you are tired or distracted. While texting. If you have been using any mind-altering substances or drugs. Wear a helmet and other protective equipment during sports activities. If you have firearms in your house, make sure you follow all gun safety procedures. Seek help if you have been physically or sexually abused. What's next? Visit your health care provider once a year for an annual wellness  visit. Ask your health care provider how often you should have your eyes and teeth checked. Stay up to date on all vaccines. This information is not intended to replace advice given to you by your health care provider. Make sure you discuss any questions you have with your health care provider. Document Revised: 08/23/2020 Document Reviewed: 08/23/2020 Elsevier Patient Education  2024 [employer].

## 2023-12-15 NOTE — Assessment & Plan Note (Signed)
 Stable on trazodone 100 mg nightly.

## 2023-12-15 NOTE — Assessment & Plan Note (Signed)
 Symptoms have improved.  She is currently following with urogynecology and they did try switching her medications however she cannot afford due to insurance lack of coverage.  We will try increasing to 50 mg daily to see how she does with this.  She can follow-up with urogynecology if this continues to be an issue.

## 2023-12-16 ENCOUNTER — Ambulatory Visit: Payer: Self-pay | Admitting: Family Medicine

## 2023-12-16 DIAGNOSIS — E785 Hyperlipidemia, unspecified: Secondary | ICD-10-CM

## 2023-12-16 DIAGNOSIS — D709 Neutropenia, unspecified: Secondary | ICD-10-CM

## 2023-12-16 NOTE — Progress Notes (Signed)
 Her cholesterol is mildly elevated.  She may benefit from starting a statin to improve her numbers and lower risk of heart attack and stroke.  Please send in Lipitor 40 mg if she is agreeable to start.  Her A1c is mildly elevated at 5.9.  Do not need to start meds for this however she should continue to work on diet and exercise and we can recheck in a year.  Her white blood cell count is also mildly decreased.  Recommend she come back in 4 weeks to recheck CBC.  All of her other labs are at goal and we can recheck everything else in a year.

## 2023-12-17 NOTE — Telephone Encounter (Signed)
 Form faxed to [phone] Copy mail to patient to address provider in patient chart  Form placed to be scan in patient chart

## 2023-12-18 ENCOUNTER — Other Ambulatory Visit: Payer: Self-pay | Admitting: *Deleted

## 2023-12-18 MED ORDER — ATORVASTATIN CALCIUM 40 MG PO TABS: 40.0000 mg | ORAL_TABLET | Freq: Every day | ORAL | 3 refills | Status: AC

## 2023-12-18 MED ORDER — MIRABEGRON ER 50 MG PO TB24: 50.0000 mg | ORAL_TABLET | Freq: Every day | ORAL | 3 refills | Status: AC

## 2023-12-18 NOTE — Telephone Encounter (Signed)
 See note

## 2023-12-18 NOTE — Telephone Encounter (Signed)
 There are many things that can cause slight decreased white blood cell count though all of these are generally benign.  It is normal for white blood cell count to fluctuate.  Please send in Lipitor 40 mg for her.  Ok to send her a printed prescription for her Myrbetriq .

## 2023-12-22 ENCOUNTER — Other Ambulatory Visit: Payer: Self-pay | Admitting: Family Medicine

## 2024-01-05 ENCOUNTER — Other Ambulatory Visit: Payer: Self-pay | Admitting: Family Medicine

## 2024-01-05 DIAGNOSIS — Z1231 Encounter for screening mammogram for malignant neoplasm of breast: Secondary | ICD-10-CM

## 2024-01-12 ENCOUNTER — Other Ambulatory Visit (INDEPENDENT_AMBULATORY_CARE_PROVIDER_SITE_OTHER)

## 2024-01-12 DIAGNOSIS — D709 Neutropenia, unspecified: Secondary | ICD-10-CM

## 2024-01-12 LAB — CBC WITH DIFFERENTIAL/PLATELET
Basophils Absolute: 0 K/uL (ref 0.0–0.1)
Basophils Relative: 0.5 % (ref 0.0–3.0)
Eosinophils Absolute: 0 K/uL (ref 0.0–0.7)
Eosinophils Relative: 0.5 % (ref 0.0–5.0)
HCT: 40 % (ref 36.0–46.0)
Hemoglobin: 13.4 g/dL (ref 12.0–15.0)
Lymphocytes Relative: 18.9 % (ref 12.0–46.0)
Lymphs Abs: 1 K/uL (ref 0.7–4.0)
MCHC: 33.5 g/dL (ref 30.0–36.0)
MCV: 86.3 fl (ref 78.0–100.0)
Monocytes Absolute: 0.3 K/uL (ref 0.1–1.0)
Monocytes Relative: 5.5 % (ref 3.0–12.0)
Neutro Abs: 3.9 K/uL (ref 1.4–7.7)
Neutrophils Relative %: 74.6 % (ref 43.0–77.0)
Platelets: 236 K/uL (ref 150.0–400.0)
RBC: 4.63 Mil/uL (ref 3.87–5.11)
RDW: 13.1 % (ref 11.5–15.5)
WBC: 5.2 K/uL (ref 4.0–10.5)

## 2024-01-13 ENCOUNTER — Ambulatory Visit: Payer: Self-pay | Admitting: Family Medicine

## 2024-01-13 NOTE — Progress Notes (Signed)
 Blood counts are back to normal.  We can recheck again in a year.

## 2024-01-22 ENCOUNTER — Other Ambulatory Visit: Payer: Self-pay | Admitting: Family Medicine

## 2024-02-23 ENCOUNTER — Ambulatory Visit

## 2024-03-17 ENCOUNTER — Ambulatory Visit

## 2024-04-07 ENCOUNTER — Inpatient Hospital Stay: Admission: RE | Admit: 2024-04-07 | Source: Ambulatory Visit

## 2024-04-07 LAB — HM MAMMOGRAPHY

## 2024-04-08 ENCOUNTER — Encounter: Payer: Self-pay | Admitting: Family Medicine

## 2024-12-20 ENCOUNTER — Encounter: Admitting: Family Medicine
